# Patient Record
Sex: Female | Born: 1940 | Race: White | Hispanic: No | Marital: Married | Smoking: Never smoker
Health system: Southern US, Community
[De-identification: ages and names within clinical notes are randomized; demographics above are authoritative.]

## PROBLEM LIST (undated history)

## (undated) DIAGNOSIS — K219 Gastro-esophageal reflux disease without esophagitis: Secondary | ICD-10-CM

## (undated) DIAGNOSIS — I10 Essential (primary) hypertension: Secondary | ICD-10-CM

## (undated) DIAGNOSIS — R011 Cardiac murmur, unspecified: Secondary | ICD-10-CM

## (undated) DIAGNOSIS — E785 Hyperlipidemia, unspecified: Secondary | ICD-10-CM

## (undated) DIAGNOSIS — F419 Anxiety disorder, unspecified: Secondary | ICD-10-CM

## (undated) DIAGNOSIS — M1990 Unspecified osteoarthritis, unspecified site: Secondary | ICD-10-CM

## (undated) HISTORY — PX: COLONOSCOPY W/ POLYPECTOMY: SHX1380

---

## 2003-02-23 ENCOUNTER — Other Ambulatory Visit: Payer: Self-pay

## 2004-02-01 ENCOUNTER — Ambulatory Visit: Payer: Self-pay | Admitting: Family Medicine

## 2004-06-06 ENCOUNTER — Emergency Department: Payer: Self-pay | Admitting: Internal Medicine

## 2004-11-03 ENCOUNTER — Other Ambulatory Visit: Payer: Self-pay

## 2004-11-03 ENCOUNTER — Emergency Department: Payer: Self-pay | Admitting: Emergency Medicine

## 2006-03-07 ENCOUNTER — Ambulatory Visit: Payer: Self-pay | Admitting: Nurse Practitioner

## 2006-03-14 ENCOUNTER — Ambulatory Visit: Payer: Self-pay | Admitting: Nurse Practitioner

## 2007-05-06 ENCOUNTER — Ambulatory Visit: Payer: Self-pay | Admitting: Family Medicine

## 2007-05-22 ENCOUNTER — Ambulatory Visit: Payer: Self-pay | Admitting: Family Medicine

## 2007-09-13 ENCOUNTER — Emergency Department: Payer: Self-pay | Admitting: Internal Medicine

## 2008-07-29 ENCOUNTER — Ambulatory Visit: Payer: Self-pay | Admitting: Family Medicine

## 2008-08-16 ENCOUNTER — Ambulatory Visit: Payer: Self-pay | Admitting: Family Medicine

## 2009-08-01 ENCOUNTER — Ambulatory Visit: Payer: Self-pay | Admitting: Family Medicine

## 2010-09-13 ENCOUNTER — Ambulatory Visit: Payer: Self-pay | Admitting: Family Medicine

## 2011-11-06 ENCOUNTER — Ambulatory Visit: Payer: Self-pay | Admitting: Family Medicine

## 2012-11-06 ENCOUNTER — Ambulatory Visit: Payer: Self-pay | Admitting: Unknown Physician Specialty

## 2012-11-07 LAB — PATHOLOGY REPORT

## 2013-01-26 ENCOUNTER — Ambulatory Visit: Payer: Self-pay | Admitting: Family Medicine

## 2013-02-02 ENCOUNTER — Ambulatory Visit: Payer: Self-pay | Admitting: Family Medicine

## 2014-03-24 ENCOUNTER — Emergency Department: Payer: Self-pay | Admitting: Emergency Medicine

## 2014-05-03 ENCOUNTER — Ambulatory Visit
Admit: 2014-05-03 | Disposition: A | Discharge status: Home / Self Care | Payer: Self-pay | Attending: Nurse Practitioner | Admitting: Nurse Practitioner

## 2014-05-31 ENCOUNTER — Other Ambulatory Visit: Payer: Self-pay | Admitting: Internal Medicine

## 2014-05-31 DIAGNOSIS — R748 Abnormal levels of other serum enzymes: Secondary | ICD-10-CM

## 2014-06-03 ENCOUNTER — Ambulatory Visit
Admission: RE | Admit: 2014-06-03 | Discharge: 2014-06-03 | Disposition: A | Discharge status: Home / Self Care | Payer: Medicare HMO | Source: Ambulatory Visit | Attending: Internal Medicine | Admitting: Internal Medicine

## 2014-06-03 DIAGNOSIS — R748 Abnormal levels of other serum enzymes: Secondary | ICD-10-CM | POA: Insufficient documentation

## 2015-01-13 DIAGNOSIS — R42 Dizziness and giddiness: Secondary | ICD-10-CM | POA: Diagnosis not present

## 2015-01-13 DIAGNOSIS — I10 Essential (primary) hypertension: Secondary | ICD-10-CM | POA: Diagnosis not present

## 2015-01-13 DIAGNOSIS — I209 Angina pectoris, unspecified: Secondary | ICD-10-CM | POA: Diagnosis not present

## 2015-01-13 DIAGNOSIS — K219 Gastro-esophageal reflux disease without esophagitis: Secondary | ICD-10-CM | POA: Diagnosis not present

## 2015-01-13 DIAGNOSIS — R079 Chest pain, unspecified: Secondary | ICD-10-CM | POA: Diagnosis not present

## 2015-01-13 DIAGNOSIS — E784 Other hyperlipidemia: Secondary | ICD-10-CM | POA: Diagnosis not present

## 2015-02-03 DIAGNOSIS — I10 Essential (primary) hypertension: Secondary | ICD-10-CM | POA: Diagnosis not present

## 2015-02-03 DIAGNOSIS — E785 Hyperlipidemia, unspecified: Secondary | ICD-10-CM | POA: Diagnosis not present

## 2015-02-10 DIAGNOSIS — M150 Primary generalized (osteo)arthritis: Secondary | ICD-10-CM | POA: Diagnosis not present

## 2015-02-10 DIAGNOSIS — I10 Essential (primary) hypertension: Secondary | ICD-10-CM | POA: Diagnosis not present

## 2015-02-10 DIAGNOSIS — K219 Gastro-esophageal reflux disease without esophagitis: Secondary | ICD-10-CM | POA: Diagnosis not present

## 2015-02-10 DIAGNOSIS — E7800 Pure hypercholesterolemia, unspecified: Secondary | ICD-10-CM | POA: Diagnosis not present

## 2015-02-10 DIAGNOSIS — F5104 Psychophysiologic insomnia: Secondary | ICD-10-CM | POA: Diagnosis not present

## 2015-02-25 DIAGNOSIS — M5442 Lumbago with sciatica, left side: Secondary | ICD-10-CM | POA: Diagnosis not present

## 2015-02-25 DIAGNOSIS — M531 Cervicobrachial syndrome: Secondary | ICD-10-CM | POA: Diagnosis not present

## 2015-02-25 DIAGNOSIS — M955 Acquired deformity of pelvis: Secondary | ICD-10-CM | POA: Diagnosis not present

## 2015-02-25 DIAGNOSIS — M9901 Segmental and somatic dysfunction of cervical region: Secondary | ICD-10-CM | POA: Diagnosis not present

## 2015-02-25 DIAGNOSIS — M9903 Segmental and somatic dysfunction of lumbar region: Secondary | ICD-10-CM | POA: Diagnosis not present

## 2015-02-25 DIAGNOSIS — M9905 Segmental and somatic dysfunction of pelvic region: Secondary | ICD-10-CM | POA: Diagnosis not present

## 2015-03-02 DIAGNOSIS — M9901 Segmental and somatic dysfunction of cervical region: Secondary | ICD-10-CM | POA: Diagnosis not present

## 2015-03-02 DIAGNOSIS — M9903 Segmental and somatic dysfunction of lumbar region: Secondary | ICD-10-CM | POA: Diagnosis not present

## 2015-03-02 DIAGNOSIS — M955 Acquired deformity of pelvis: Secondary | ICD-10-CM | POA: Diagnosis not present

## 2015-03-02 DIAGNOSIS — M531 Cervicobrachial syndrome: Secondary | ICD-10-CM | POA: Diagnosis not present

## 2015-03-02 DIAGNOSIS — M5442 Lumbago with sciatica, left side: Secondary | ICD-10-CM | POA: Diagnosis not present

## 2015-03-02 DIAGNOSIS — M9905 Segmental and somatic dysfunction of pelvic region: Secondary | ICD-10-CM | POA: Diagnosis not present

## 2015-03-08 DIAGNOSIS — K602 Anal fissure, unspecified: Secondary | ICD-10-CM | POA: Diagnosis not present

## 2015-03-16 DIAGNOSIS — M461 Sacroiliitis, not elsewhere classified: Secondary | ICD-10-CM | POA: Diagnosis not present

## 2015-03-16 DIAGNOSIS — M5136 Other intervertebral disc degeneration, lumbar region: Secondary | ICD-10-CM | POA: Diagnosis not present

## 2015-03-16 DIAGNOSIS — M9903 Segmental and somatic dysfunction of lumbar region: Secondary | ICD-10-CM | POA: Diagnosis not present

## 2015-03-16 DIAGNOSIS — M9902 Segmental and somatic dysfunction of thoracic region: Secondary | ICD-10-CM | POA: Diagnosis not present

## 2015-03-16 DIAGNOSIS — M6080 Other myositis, unspecified site: Secondary | ICD-10-CM | POA: Diagnosis not present

## 2015-03-16 DIAGNOSIS — M9905 Segmental and somatic dysfunction of pelvic region: Secondary | ICD-10-CM | POA: Diagnosis not present

## 2015-03-30 DIAGNOSIS — M5136 Other intervertebral disc degeneration, lumbar region: Secondary | ICD-10-CM | POA: Diagnosis not present

## 2015-03-30 DIAGNOSIS — M6080 Other myositis, unspecified site: Secondary | ICD-10-CM | POA: Diagnosis not present

## 2015-03-30 DIAGNOSIS — M9903 Segmental and somatic dysfunction of lumbar region: Secondary | ICD-10-CM | POA: Diagnosis not present

## 2015-03-30 DIAGNOSIS — M461 Sacroiliitis, not elsewhere classified: Secondary | ICD-10-CM | POA: Diagnosis not present

## 2015-03-30 DIAGNOSIS — M9905 Segmental and somatic dysfunction of pelvic region: Secondary | ICD-10-CM | POA: Diagnosis not present

## 2015-03-30 DIAGNOSIS — M9902 Segmental and somatic dysfunction of thoracic region: Secondary | ICD-10-CM | POA: Diagnosis not present

## 2015-04-20 DIAGNOSIS — M5136 Other intervertebral disc degeneration, lumbar region: Secondary | ICD-10-CM | POA: Diagnosis not present

## 2015-04-20 DIAGNOSIS — M9902 Segmental and somatic dysfunction of thoracic region: Secondary | ICD-10-CM | POA: Diagnosis not present

## 2015-04-20 DIAGNOSIS — M6080 Other myositis, unspecified site: Secondary | ICD-10-CM | POA: Diagnosis not present

## 2015-04-20 DIAGNOSIS — M461 Sacroiliitis, not elsewhere classified: Secondary | ICD-10-CM | POA: Diagnosis not present

## 2015-04-20 DIAGNOSIS — M9905 Segmental and somatic dysfunction of pelvic region: Secondary | ICD-10-CM | POA: Diagnosis not present

## 2015-04-20 DIAGNOSIS — M9903 Segmental and somatic dysfunction of lumbar region: Secondary | ICD-10-CM | POA: Diagnosis not present

## 2015-05-10 DIAGNOSIS — M9905 Segmental and somatic dysfunction of pelvic region: Secondary | ICD-10-CM | POA: Diagnosis not present

## 2015-05-10 DIAGNOSIS — M9902 Segmental and somatic dysfunction of thoracic region: Secondary | ICD-10-CM | POA: Diagnosis not present

## 2015-05-10 DIAGNOSIS — M461 Sacroiliitis, not elsewhere classified: Secondary | ICD-10-CM | POA: Diagnosis not present

## 2015-05-10 DIAGNOSIS — M9903 Segmental and somatic dysfunction of lumbar region: Secondary | ICD-10-CM | POA: Diagnosis not present

## 2015-05-10 DIAGNOSIS — M6080 Other myositis, unspecified site: Secondary | ICD-10-CM | POA: Diagnosis not present

## 2015-05-10 DIAGNOSIS — M5136 Other intervertebral disc degeneration, lumbar region: Secondary | ICD-10-CM | POA: Diagnosis not present

## 2015-05-16 DIAGNOSIS — Z8679 Personal history of other diseases of the circulatory system: Secondary | ICD-10-CM | POA: Diagnosis not present

## 2015-05-25 DIAGNOSIS — R197 Diarrhea, unspecified: Secondary | ICD-10-CM | POA: Diagnosis not present

## 2015-05-26 DIAGNOSIS — K529 Noninfective gastroenteritis and colitis, unspecified: Secondary | ICD-10-CM | POA: Diagnosis not present

## 2015-05-31 DIAGNOSIS — K219 Gastro-esophageal reflux disease without esophagitis: Secondary | ICD-10-CM | POA: Diagnosis not present

## 2015-05-31 DIAGNOSIS — M150 Primary generalized (osteo)arthritis: Secondary | ICD-10-CM | POA: Diagnosis not present

## 2015-05-31 DIAGNOSIS — I10 Essential (primary) hypertension: Secondary | ICD-10-CM | POA: Diagnosis not present

## 2015-05-31 DIAGNOSIS — J0100 Acute maxillary sinusitis, unspecified: Secondary | ICD-10-CM | POA: Diagnosis not present

## 2015-08-08 DIAGNOSIS — F419 Anxiety disorder, unspecified: Secondary | ICD-10-CM | POA: Diagnosis not present

## 2015-08-08 DIAGNOSIS — K219 Gastro-esophageal reflux disease without esophagitis: Secondary | ICD-10-CM | POA: Diagnosis not present

## 2015-09-05 DIAGNOSIS — K219 Gastro-esophageal reflux disease without esophagitis: Secondary | ICD-10-CM | POA: Diagnosis not present

## 2015-09-21 DIAGNOSIS — L299 Pruritus, unspecified: Secondary | ICD-10-CM | POA: Diagnosis not present

## 2015-09-21 DIAGNOSIS — E7800 Pure hypercholesterolemia, unspecified: Secondary | ICD-10-CM | POA: Diagnosis not present

## 2015-09-21 DIAGNOSIS — R21 Rash and other nonspecific skin eruption: Secondary | ICD-10-CM | POA: Diagnosis not present

## 2015-09-21 DIAGNOSIS — Z131 Encounter for screening for diabetes mellitus: Secondary | ICD-10-CM | POA: Diagnosis not present

## 2015-09-21 DIAGNOSIS — K219 Gastro-esophageal reflux disease without esophagitis: Secondary | ICD-10-CM | POA: Diagnosis not present

## 2015-09-22 DIAGNOSIS — M9902 Segmental and somatic dysfunction of thoracic region: Secondary | ICD-10-CM | POA: Diagnosis not present

## 2015-09-22 DIAGNOSIS — M6080 Other myositis, unspecified site: Secondary | ICD-10-CM | POA: Diagnosis not present

## 2015-09-22 DIAGNOSIS — M9903 Segmental and somatic dysfunction of lumbar region: Secondary | ICD-10-CM | POA: Diagnosis not present

## 2015-09-22 DIAGNOSIS — M461 Sacroiliitis, not elsewhere classified: Secondary | ICD-10-CM | POA: Diagnosis not present

## 2015-09-22 DIAGNOSIS — M9905 Segmental and somatic dysfunction of pelvic region: Secondary | ICD-10-CM | POA: Diagnosis not present

## 2015-09-22 DIAGNOSIS — M5136 Other intervertebral disc degeneration, lumbar region: Secondary | ICD-10-CM | POA: Diagnosis not present

## 2015-09-28 DIAGNOSIS — M461 Sacroiliitis, not elsewhere classified: Secondary | ICD-10-CM | POA: Diagnosis not present

## 2015-09-28 DIAGNOSIS — M9902 Segmental and somatic dysfunction of thoracic region: Secondary | ICD-10-CM | POA: Diagnosis not present

## 2015-09-28 DIAGNOSIS — M9903 Segmental and somatic dysfunction of lumbar region: Secondary | ICD-10-CM | POA: Diagnosis not present

## 2015-09-28 DIAGNOSIS — M5136 Other intervertebral disc degeneration, lumbar region: Secondary | ICD-10-CM | POA: Diagnosis not present

## 2015-09-28 DIAGNOSIS — M9905 Segmental and somatic dysfunction of pelvic region: Secondary | ICD-10-CM | POA: Diagnosis not present

## 2015-09-28 DIAGNOSIS — M6080 Other myositis, unspecified site: Secondary | ICD-10-CM | POA: Diagnosis not present

## 2015-09-30 DIAGNOSIS — D1801 Hemangioma of skin and subcutaneous tissue: Secondary | ICD-10-CM | POA: Diagnosis not present

## 2015-09-30 DIAGNOSIS — L728 Other follicular cysts of the skin and subcutaneous tissue: Secondary | ICD-10-CM | POA: Diagnosis not present

## 2015-10-08 DIAGNOSIS — M25462 Effusion, left knee: Secondary | ICD-10-CM | POA: Diagnosis not present

## 2015-10-08 DIAGNOSIS — M25562 Pain in left knee: Secondary | ICD-10-CM | POA: Diagnosis not present

## 2015-10-11 DIAGNOSIS — M461 Sacroiliitis, not elsewhere classified: Secondary | ICD-10-CM | POA: Diagnosis not present

## 2015-10-11 DIAGNOSIS — M6080 Other myositis, unspecified site: Secondary | ICD-10-CM | POA: Diagnosis not present

## 2015-10-11 DIAGNOSIS — M5136 Other intervertebral disc degeneration, lumbar region: Secondary | ICD-10-CM | POA: Diagnosis not present

## 2015-10-11 DIAGNOSIS — M9905 Segmental and somatic dysfunction of pelvic region: Secondary | ICD-10-CM | POA: Diagnosis not present

## 2015-10-11 DIAGNOSIS — M9902 Segmental and somatic dysfunction of thoracic region: Secondary | ICD-10-CM | POA: Diagnosis not present

## 2015-10-11 DIAGNOSIS — M9903 Segmental and somatic dysfunction of lumbar region: Secondary | ICD-10-CM | POA: Diagnosis not present

## 2015-10-12 DIAGNOSIS — M6080 Other myositis, unspecified site: Secondary | ICD-10-CM | POA: Diagnosis not present

## 2015-10-12 DIAGNOSIS — M5136 Other intervertebral disc degeneration, lumbar region: Secondary | ICD-10-CM | POA: Diagnosis not present

## 2015-10-12 DIAGNOSIS — M9902 Segmental and somatic dysfunction of thoracic region: Secondary | ICD-10-CM | POA: Diagnosis not present

## 2015-10-12 DIAGNOSIS — M9905 Segmental and somatic dysfunction of pelvic region: Secondary | ICD-10-CM | POA: Diagnosis not present

## 2015-10-12 DIAGNOSIS — M9903 Segmental and somatic dysfunction of lumbar region: Secondary | ICD-10-CM | POA: Diagnosis not present

## 2015-10-12 DIAGNOSIS — M461 Sacroiliitis, not elsewhere classified: Secondary | ICD-10-CM | POA: Diagnosis not present

## 2015-10-13 DIAGNOSIS — M9903 Segmental and somatic dysfunction of lumbar region: Secondary | ICD-10-CM | POA: Diagnosis not present

## 2015-10-13 DIAGNOSIS — M461 Sacroiliitis, not elsewhere classified: Secondary | ICD-10-CM | POA: Diagnosis not present

## 2015-10-13 DIAGNOSIS — M6080 Other myositis, unspecified site: Secondary | ICD-10-CM | POA: Diagnosis not present

## 2015-10-13 DIAGNOSIS — M5136 Other intervertebral disc degeneration, lumbar region: Secondary | ICD-10-CM | POA: Diagnosis not present

## 2015-10-13 DIAGNOSIS — M9902 Segmental and somatic dysfunction of thoracic region: Secondary | ICD-10-CM | POA: Diagnosis not present

## 2015-10-13 DIAGNOSIS — M9905 Segmental and somatic dysfunction of pelvic region: Secondary | ICD-10-CM | POA: Diagnosis not present

## 2015-10-17 DIAGNOSIS — M25562 Pain in left knee: Secondary | ICD-10-CM | POA: Diagnosis not present

## 2015-10-17 DIAGNOSIS — M5431 Sciatica, right side: Secondary | ICD-10-CM | POA: Diagnosis not present

## 2015-11-04 ENCOUNTER — Other Ambulatory Visit: Payer: Self-pay | Admitting: Orthopedic Surgery

## 2015-11-04 DIAGNOSIS — M1712 Unilateral primary osteoarthritis, left knee: Secondary | ICD-10-CM | POA: Diagnosis not present

## 2015-11-04 DIAGNOSIS — M47816 Spondylosis without myelopathy or radiculopathy, lumbar region: Secondary | ICD-10-CM

## 2015-11-04 DIAGNOSIS — M5136 Other intervertebral disc degeneration, lumbar region: Secondary | ICD-10-CM | POA: Diagnosis not present

## 2015-11-04 DIAGNOSIS — M545 Low back pain: Secondary | ICD-10-CM | POA: Diagnosis not present

## 2015-11-04 DIAGNOSIS — M5416 Radiculopathy, lumbar region: Secondary | ICD-10-CM | POA: Diagnosis not present

## 2015-11-16 ENCOUNTER — Ambulatory Visit
Admission: RE | Admit: 2015-11-16 | Discharge: 2015-11-16 | Disposition: A | Discharge status: Home / Self Care | Payer: Commercial Managed Care - HMO | Source: Ambulatory Visit | Attending: Orthopedic Surgery | Admitting: Orthopedic Surgery

## 2015-11-16 DIAGNOSIS — M5126 Other intervertebral disc displacement, lumbar region: Secondary | ICD-10-CM | POA: Diagnosis not present

## 2015-11-16 DIAGNOSIS — M47816 Spondylosis without myelopathy or radiculopathy, lumbar region: Secondary | ICD-10-CM | POA: Insufficient documentation

## 2015-11-16 DIAGNOSIS — M5136 Other intervertebral disc degeneration, lumbar region: Secondary | ICD-10-CM | POA: Diagnosis not present

## 2015-12-11 DIAGNOSIS — K219 Gastro-esophageal reflux disease without esophagitis: Secondary | ICD-10-CM | POA: Diagnosis not present

## 2015-12-21 DIAGNOSIS — K219 Gastro-esophageal reflux disease without esophagitis: Secondary | ICD-10-CM | POA: Diagnosis not present

## 2015-12-21 DIAGNOSIS — I10 Essential (primary) hypertension: Secondary | ICD-10-CM | POA: Diagnosis not present

## 2015-12-21 DIAGNOSIS — E7800 Pure hypercholesterolemia, unspecified: Secondary | ICD-10-CM | POA: Diagnosis not present

## 2015-12-30 DIAGNOSIS — M180 Bilateral primary osteoarthritis of first carpometacarpal joints: Secondary | ICD-10-CM | POA: Diagnosis not present

## 2016-01-31 DIAGNOSIS — F419 Anxiety disorder, unspecified: Secondary | ICD-10-CM | POA: Diagnosis not present

## 2016-01-31 DIAGNOSIS — I10 Essential (primary) hypertension: Secondary | ICD-10-CM | POA: Diagnosis not present

## 2016-01-31 DIAGNOSIS — R079 Chest pain, unspecified: Secondary | ICD-10-CM | POA: Diagnosis not present

## 2016-01-31 DIAGNOSIS — E784 Other hyperlipidemia: Secondary | ICD-10-CM | POA: Diagnosis not present

## 2016-01-31 DIAGNOSIS — R9431 Abnormal electrocardiogram [ECG] [EKG]: Secondary | ICD-10-CM | POA: Diagnosis not present

## 2016-01-31 DIAGNOSIS — R42 Dizziness and giddiness: Secondary | ICD-10-CM | POA: Diagnosis not present

## 2016-01-31 DIAGNOSIS — K219 Gastro-esophageal reflux disease without esophagitis: Secondary | ICD-10-CM | POA: Diagnosis not present

## 2016-01-31 DIAGNOSIS — I208 Other forms of angina pectoris: Secondary | ICD-10-CM | POA: Diagnosis not present

## 2016-01-31 DIAGNOSIS — R0602 Shortness of breath: Secondary | ICD-10-CM | POA: Diagnosis not present

## 2016-02-16 DIAGNOSIS — R0602 Shortness of breath: Secondary | ICD-10-CM | POA: Diagnosis not present

## 2016-02-16 DIAGNOSIS — I208 Other forms of angina pectoris: Secondary | ICD-10-CM | POA: Diagnosis not present

## 2016-02-16 DIAGNOSIS — R9431 Abnormal electrocardiogram [ECG] [EKG]: Secondary | ICD-10-CM | POA: Diagnosis not present

## 2016-04-04 DIAGNOSIS — R300 Dysuria: Secondary | ICD-10-CM | POA: Diagnosis not present

## 2016-04-04 DIAGNOSIS — J069 Acute upper respiratory infection, unspecified: Secondary | ICD-10-CM | POA: Diagnosis not present

## 2016-04-04 DIAGNOSIS — I10 Essential (primary) hypertension: Secondary | ICD-10-CM | POA: Diagnosis not present

## 2016-04-12 DIAGNOSIS — J4520 Mild intermittent asthma, uncomplicated: Secondary | ICD-10-CM | POA: Diagnosis not present

## 2016-04-25 DIAGNOSIS — M25562 Pain in left knee: Secondary | ICD-10-CM | POA: Diagnosis not present

## 2016-04-25 DIAGNOSIS — M1712 Unilateral primary osteoarthritis, left knee: Secondary | ICD-10-CM | POA: Diagnosis not present

## 2016-05-04 DIAGNOSIS — L03111 Cellulitis of right axilla: Secondary | ICD-10-CM | POA: Diagnosis not present

## 2016-06-28 DIAGNOSIS — K219 Gastro-esophageal reflux disease without esophagitis: Secondary | ICD-10-CM | POA: Diagnosis not present

## 2016-06-28 DIAGNOSIS — Z1231 Encounter for screening mammogram for malignant neoplasm of breast: Secondary | ICD-10-CM | POA: Diagnosis not present

## 2016-06-28 DIAGNOSIS — Z0000 Encounter for general adult medical examination without abnormal findings: Secondary | ICD-10-CM | POA: Diagnosis not present

## 2016-06-28 DIAGNOSIS — Z780 Asymptomatic menopausal state: Secondary | ICD-10-CM | POA: Diagnosis not present

## 2016-06-28 DIAGNOSIS — Z23 Encounter for immunization: Secondary | ICD-10-CM | POA: Diagnosis not present

## 2016-06-28 DIAGNOSIS — E7800 Pure hypercholesterolemia, unspecified: Secondary | ICD-10-CM | POA: Diagnosis not present

## 2016-06-28 DIAGNOSIS — I10 Essential (primary) hypertension: Secondary | ICD-10-CM | POA: Diagnosis not present

## 2016-07-09 DIAGNOSIS — W57XXXA Bitten or stung by nonvenomous insect and other nonvenomous arthropods, initial encounter: Secondary | ICD-10-CM | POA: Diagnosis not present

## 2016-07-09 DIAGNOSIS — S30861A Insect bite (nonvenomous) of abdominal wall, initial encounter: Secondary | ICD-10-CM | POA: Diagnosis not present

## 2016-07-10 DIAGNOSIS — M8588 Other specified disorders of bone density and structure, other site: Secondary | ICD-10-CM | POA: Diagnosis not present

## 2016-10-18 DIAGNOSIS — R195 Other fecal abnormalities: Secondary | ICD-10-CM | POA: Diagnosis not present

## 2016-10-24 ENCOUNTER — Other Ambulatory Visit: Payer: Self-pay | Admitting: Internal Medicine

## 2016-10-24 DIAGNOSIS — K5909 Other constipation: Secondary | ICD-10-CM | POA: Diagnosis not present

## 2016-10-24 DIAGNOSIS — K219 Gastro-esophageal reflux disease without esophagitis: Secondary | ICD-10-CM | POA: Diagnosis not present

## 2016-10-24 DIAGNOSIS — R739 Hyperglycemia, unspecified: Secondary | ICD-10-CM | POA: Diagnosis not present

## 2016-10-24 DIAGNOSIS — Z1231 Encounter for screening mammogram for malignant neoplasm of breast: Secondary | ICD-10-CM

## 2016-10-24 DIAGNOSIS — Z1211 Encounter for screening for malignant neoplasm of colon: Secondary | ICD-10-CM | POA: Diagnosis not present

## 2016-10-24 DIAGNOSIS — E7800 Pure hypercholesterolemia, unspecified: Secondary | ICD-10-CM | POA: Diagnosis not present

## 2016-10-25 DIAGNOSIS — H2511 Age-related nuclear cataract, right eye: Secondary | ICD-10-CM | POA: Diagnosis not present

## 2016-10-26 ENCOUNTER — Ambulatory Visit
Admission: RE | Admit: 2016-10-26 | Discharge: 2016-10-26 | Disposition: A | Discharge status: Home / Self Care | Payer: Medicare HMO | Source: Ambulatory Visit | Attending: Internal Medicine | Admitting: Internal Medicine

## 2016-10-26 DIAGNOSIS — Z1231 Encounter for screening mammogram for malignant neoplasm of breast: Secondary | ICD-10-CM | POA: Diagnosis not present

## 2016-10-30 ENCOUNTER — Other Ambulatory Visit: Payer: Self-pay | Admitting: Internal Medicine

## 2016-10-30 DIAGNOSIS — N6489 Other specified disorders of breast: Secondary | ICD-10-CM

## 2016-10-30 DIAGNOSIS — R928 Other abnormal and inconclusive findings on diagnostic imaging of breast: Secondary | ICD-10-CM

## 2016-11-08 ENCOUNTER — Ambulatory Visit
Admission: RE | Admit: 2016-11-08 | Discharge: 2016-11-08 | Disposition: A | Discharge status: Home / Self Care | Payer: Medicare HMO | Source: Ambulatory Visit | Attending: Internal Medicine | Admitting: Internal Medicine

## 2016-11-08 DIAGNOSIS — N6489 Other specified disorders of breast: Secondary | ICD-10-CM | POA: Diagnosis not present

## 2016-11-08 DIAGNOSIS — R928 Other abnormal and inconclusive findings on diagnostic imaging of breast: Secondary | ICD-10-CM

## 2016-12-12 DIAGNOSIS — Z86010 Personal history of colonic polyps: Secondary | ICD-10-CM | POA: Diagnosis not present

## 2016-12-12 DIAGNOSIS — K5909 Other constipation: Secondary | ICD-10-CM | POA: Diagnosis not present

## 2016-12-12 DIAGNOSIS — K219 Gastro-esophageal reflux disease without esophagitis: Secondary | ICD-10-CM | POA: Diagnosis not present

## 2017-01-25 DIAGNOSIS — M25542 Pain in joints of left hand: Secondary | ICD-10-CM | POA: Diagnosis not present

## 2017-01-25 DIAGNOSIS — M79642 Pain in left hand: Secondary | ICD-10-CM | POA: Diagnosis not present

## 2017-01-25 DIAGNOSIS — M25541 Pain in joints of right hand: Secondary | ICD-10-CM | POA: Diagnosis not present

## 2017-01-25 DIAGNOSIS — K219 Gastro-esophageal reflux disease without esophagitis: Secondary | ICD-10-CM | POA: Diagnosis not present

## 2017-01-25 DIAGNOSIS — R739 Hyperglycemia, unspecified: Secondary | ICD-10-CM | POA: Diagnosis not present

## 2017-01-25 DIAGNOSIS — E7800 Pure hypercholesterolemia, unspecified: Secondary | ICD-10-CM | POA: Diagnosis not present

## 2017-01-28 DIAGNOSIS — H02055 Trichiasis without entropian left lower eyelid: Secondary | ICD-10-CM | POA: Diagnosis not present

## 2017-01-31 DIAGNOSIS — F419 Anxiety disorder, unspecified: Secondary | ICD-10-CM | POA: Diagnosis not present

## 2017-01-31 DIAGNOSIS — R079 Chest pain, unspecified: Secondary | ICD-10-CM | POA: Diagnosis not present

## 2017-01-31 DIAGNOSIS — I10 Essential (primary) hypertension: Secondary | ICD-10-CM | POA: Diagnosis not present

## 2017-01-31 DIAGNOSIS — R0602 Shortness of breath: Secondary | ICD-10-CM | POA: Diagnosis not present

## 2017-01-31 DIAGNOSIS — I208 Other forms of angina pectoris: Secondary | ICD-10-CM | POA: Diagnosis not present

## 2017-01-31 DIAGNOSIS — R9431 Abnormal electrocardiogram [ECG] [EKG]: Secondary | ICD-10-CM | POA: Diagnosis not present

## 2017-01-31 DIAGNOSIS — R42 Dizziness and giddiness: Secondary | ICD-10-CM | POA: Diagnosis not present

## 2017-01-31 DIAGNOSIS — E7849 Other hyperlipidemia: Secondary | ICD-10-CM | POA: Diagnosis not present

## 2017-01-31 DIAGNOSIS — K219 Gastro-esophageal reflux disease without esophagitis: Secondary | ICD-10-CM | POA: Diagnosis not present

## 2017-02-01 DIAGNOSIS — Z23 Encounter for immunization: Secondary | ICD-10-CM | POA: Diagnosis not present

## 2017-02-01 DIAGNOSIS — Z0000 Encounter for general adult medical examination without abnormal findings: Secondary | ICD-10-CM | POA: Diagnosis not present

## 2017-03-20 DIAGNOSIS — J209 Acute bronchitis, unspecified: Secondary | ICD-10-CM | POA: Diagnosis not present

## 2017-03-22 DIAGNOSIS — M180 Bilateral primary osteoarthritis of first carpometacarpal joints: Secondary | ICD-10-CM | POA: Diagnosis not present

## 2017-04-10 ENCOUNTER — Encounter: Admission: RE | Payer: Self-pay | Source: Ambulatory Visit

## 2017-04-10 ENCOUNTER — Ambulatory Visit
Admission: RE | Admit: 2017-04-10 | Payer: Medicare HMO | Source: Ambulatory Visit | Admitting: Unknown Physician Specialty

## 2017-04-10 ENCOUNTER — Ambulatory Visit: Admit: 2017-04-10 | Payer: Medicare HMO | Admitting: Unknown Physician Specialty

## 2017-04-10 SURGERY — COLONOSCOPY WITH PROPOFOL
Anesthesia: General

## 2017-05-09 DIAGNOSIS — M170 Bilateral primary osteoarthritis of knee: Secondary | ICD-10-CM | POA: Diagnosis not present

## 2017-05-09 DIAGNOSIS — R001 Bradycardia, unspecified: Secondary | ICD-10-CM | POA: Diagnosis not present

## 2017-06-06 DIAGNOSIS — H02055 Trichiasis without entropian left lower eyelid: Secondary | ICD-10-CM | POA: Diagnosis not present

## 2017-06-27 DIAGNOSIS — W57XXXA Bitten or stung by nonvenomous insect and other nonvenomous arthropods, initial encounter: Secondary | ICD-10-CM | POA: Diagnosis not present

## 2017-06-27 DIAGNOSIS — S80861A Insect bite (nonvenomous), right lower leg, initial encounter: Secondary | ICD-10-CM | POA: Diagnosis not present

## 2017-06-27 DIAGNOSIS — S80862A Insect bite (nonvenomous), left lower leg, initial encounter: Secondary | ICD-10-CM | POA: Diagnosis not present

## 2017-08-09 DIAGNOSIS — R001 Bradycardia, unspecified: Secondary | ICD-10-CM | POA: Diagnosis not present

## 2017-08-15 DIAGNOSIS — R002 Palpitations: Secondary | ICD-10-CM | POA: Diagnosis not present

## 2017-08-15 DIAGNOSIS — I10 Essential (primary) hypertension: Secondary | ICD-10-CM | POA: Diagnosis not present

## 2017-10-14 DIAGNOSIS — H02055 Trichiasis without entropian left lower eyelid: Secondary | ICD-10-CM | POA: Diagnosis not present

## 2017-11-14 DIAGNOSIS — L03811 Cellulitis of head [any part, except face]: Secondary | ICD-10-CM | POA: Diagnosis not present

## 2018-01-02 DIAGNOSIS — R109 Unspecified abdominal pain: Secondary | ICD-10-CM | POA: Diagnosis not present

## 2018-01-02 DIAGNOSIS — K219 Gastro-esophageal reflux disease without esophagitis: Secondary | ICD-10-CM | POA: Diagnosis not present

## 2018-01-02 DIAGNOSIS — Z86010 Personal history of colonic polyps: Secondary | ICD-10-CM | POA: Diagnosis not present

## 2018-03-03 ENCOUNTER — Ambulatory Visit: Payer: Medicare Other | Admitting: Anesthesiology

## 2018-03-03 ENCOUNTER — Encounter: Payer: Self-pay | Admitting: Anesthesiology

## 2018-03-03 ENCOUNTER — Encounter
Admission: RE | Disposition: A | Discharge status: Home / Self Care | Payer: Self-pay | Source: Home / Self Care | Attending: Unknown Physician Specialty

## 2018-03-03 ENCOUNTER — Ambulatory Visit
Admission: RE | Admit: 2018-03-03 | Discharge: 2018-03-03 | Disposition: A | Discharge status: Home / Self Care | Payer: Medicare Other | Attending: Unknown Physician Specialty | Admitting: Unknown Physician Specialty

## 2018-03-03 DIAGNOSIS — Z86010 Personal history of colonic polyps: Secondary | ICD-10-CM | POA: Insufficient documentation

## 2018-03-03 DIAGNOSIS — Z1211 Encounter for screening for malignant neoplasm of colon: Secondary | ICD-10-CM | POA: Insufficient documentation

## 2018-03-03 DIAGNOSIS — Z7982 Long term (current) use of aspirin: Secondary | ICD-10-CM | POA: Diagnosis not present

## 2018-03-03 DIAGNOSIS — K640 First degree hemorrhoids: Secondary | ICD-10-CM | POA: Diagnosis not present

## 2018-03-03 DIAGNOSIS — Z79899 Other long term (current) drug therapy: Secondary | ICD-10-CM | POA: Insufficient documentation

## 2018-03-03 DIAGNOSIS — I10 Essential (primary) hypertension: Secondary | ICD-10-CM | POA: Insufficient documentation

## 2018-03-03 DIAGNOSIS — D123 Benign neoplasm of transverse colon: Secondary | ICD-10-CM | POA: Insufficient documentation

## 2018-03-03 DIAGNOSIS — K635 Polyp of colon: Secondary | ICD-10-CM | POA: Insufficient documentation

## 2018-03-03 DIAGNOSIS — Z87891 Personal history of nicotine dependence: Secondary | ICD-10-CM | POA: Insufficient documentation

## 2018-03-03 DIAGNOSIS — E785 Hyperlipidemia, unspecified: Secondary | ICD-10-CM | POA: Diagnosis not present

## 2018-03-03 DIAGNOSIS — K219 Gastro-esophageal reflux disease without esophagitis: Secondary | ICD-10-CM | POA: Diagnosis not present

## 2018-03-03 HISTORY — DX: Gastro-esophageal reflux disease without esophagitis: K21.9

## 2018-03-03 HISTORY — DX: Essential (primary) hypertension: I10

## 2018-03-03 HISTORY — PX: COLONOSCOPY WITH PROPOFOL: SHX5780

## 2018-03-03 HISTORY — DX: Hyperlipidemia, unspecified: E78.5

## 2018-03-03 SURGERY — COLONOSCOPY WITH PROPOFOL
Anesthesia: General

## 2018-03-03 MED ORDER — FENTANYL CITRATE (PF) 100 MCG/2ML IJ SOLN
INTRAMUSCULAR | Status: AC
  Filled 2018-03-03: qty 2

## 2018-03-03 MED ORDER — PROPOFOL 500 MG/50ML IV EMUL
INTRAVENOUS | Status: AC
  Filled 2018-03-03: qty 50

## 2018-03-03 MED ORDER — SODIUM CHLORIDE 0.9 % IV SOLN: INTRAVENOUS | Status: DC

## 2018-03-03 MED ORDER — SODIUM CHLORIDE 0.9 % IV SOLN
INTRAVENOUS | Status: DC
  Administered 2018-03-03: 13:00:00 via INTRAVENOUS

## 2018-03-03 MED ORDER — PROPOFOL 500 MG/50ML IV EMUL
INTRAVENOUS | Status: DC | PRN
  Administered 2018-03-03: 120 ug/kg/min via INTRAVENOUS

## 2018-03-03 MED ORDER — FENTANYL CITRATE (PF) 100 MCG/2ML IJ SOLN
INTRAMUSCULAR | Status: DC | PRN
  Administered 2018-03-03 (×2): 50 ug via INTRAVENOUS

## 2018-03-03 NOTE — Anesthesia Preprocedure Evaluation (Signed)
Anesthesia Evaluation  Patient identified by MRN, date of birth, ID band Patient awake    Reviewed: Allergy & Precautions, NPO status , Patient's Chart, lab work & pertinent test results  History of Anesthesia Complications Negative for: history of anesthetic complications  Airway Mallampati: II  TM Distance: >3 FB Neck ROM: Full    Dental no notable dental hx.    Pulmonary neg sleep apnea, neg COPD, former smoker,    breath sounds clear to auscultation- rhonchi (-) wheezing      Cardiovascular hypertension, Pt. on medications (-) CAD, (-) Past MI, (-) Cardiac Stents and (-) CABG  Rhythm:Regular Rate:Normal - Systolic murmurs and - Diastolic murmurs    Neuro/Psych neg Seizures negative neurological ROS  negative psych ROS   GI/Hepatic Neg liver ROS, GERD  ,  Endo/Other  negative endocrine ROSneg diabetes  Renal/GU negative Renal ROS     Musculoskeletal negative musculoskeletal ROS (+)   Abdominal (+) + obese,   Peds  Hematology negative hematology ROS (+)   Anesthesia Other Findings Past Medical History: No date: GERD (gastroesophageal reflux disease) No date: Hyperlipidemia No date: Hypertension   Reproductive/Obstetrics                             Anesthesia Physical Anesthesia Plan  ASA: II  Anesthesia Plan: General   Post-op Pain Management:    Induction: Intravenous  PONV Risk Score and Plan: 2 and Propofol infusion  Airway Management Planned: Natural Airway  Additional Equipment:   Intra-op Plan:   Post-operative Plan:   Informed Consent: I have reviewed the patients History and Physical, chart, labs and discussed the procedure including the risks, benefits and alternatives for the proposed anesthesia with the patient or authorized representative who has indicated his/her understanding and acceptance.     Dental advisory given  Plan Discussed with: CRNA and  Anesthesiologist  Anesthesia Plan Comments:         Anesthesia Quick Evaluation

## 2018-03-03 NOTE — Transfer of Care (Signed)
Immediate Anesthesia Transfer of Care Note  Patient: Karen Harvey  Procedure(s) Performed: COLONOSCOPY WITH PROPOFOL (N/A )  Patient Location: PACU  Anesthesia Type:General  Level of Consciousness: awake and sedated  Airway & Oxygen Therapy: Patient Spontanous Breathing and Patient connected to nasal cannula oxygen  Post-op Assessment: Report given to RN and Post -op Vital signs reviewed and stable  Post vital signs: Reviewed and stable  Last Vitals:  Vitals Value Taken Time  BP    Temp    Pulse    Resp    SpO2      Last Pain:  Vitals:   03/03/18 1303  TempSrc: Tympanic         Complications: No apparent anesthesia complications

## 2018-03-03 NOTE — Op Note (Signed)
[facility] Gastroenterology Patient Name: Karen Harvey Procedure Date: 03/03/2018 1:42 PM MRN: [id number] Account #: [id number] Date of Birth: [birth year] Admit Type: Outpatient Age: 78 Room: [facility] ENDO ROOM 1 Gender: Female Note Status: Finalized Procedure:            Colonoscopy Indications:          High risk colon cancer surveillance: Personal history                        of colonic polyps Providers:            Manya Silvas, MD Referring MD:         Glendon Axe (Referring MD) Medicines:            Propofol per Anesthesia Complications:        No immediate complications. Procedure:            Pre-Anesthesia Assessment:                       - After reviewing the risks and benefits, the patient                        was deemed in satisfactory condition to undergo the                        procedure.                       After obtaining informed consent, the colonoscope was                        passed under direct vision. Throughout the procedure,                        the patient's blood pressure, pulse, and oxygen                        saturations were monitored continuously. The                        Colonoscope was introduced through the anus and                        advanced to the the cecum, identified by appendiceal                        orifice and ileocecal valve. The colonoscopy was                        performed without difficulty. The patient tolerated the                        procedure well. The quality of the bowel preparation                        was good. Findings:      Three sessile polyps were found in the transverse colon. The polyps were       diminutive in size. These polyps were removed with a jumbo cold forceps.       Resection and retrieval were complete.      Two sessile polyps were  found in the recto-sigmoid colon. The polyps       were diminutive in size. These polyps were removed with a jumbo cold   forceps. Resection and retrieval were complete.      Internal hemorrhoids were found during endoscopy. The hemorrhoids were       small and Grade I (internal hemorrhoids that do not prolapse). Impression:           - Three diminutive polyps in the transverse colon,                        removed with a jumbo cold forceps. Resected and                        retrieved.                       - Two diminutive polyps at the recto-sigmoid colon,                        removed with a jumbo cold forceps. Resected and                        retrieved.                       - Internal hemorrhoids. Recommendation:       - Await pathology results. Procedure Code(s):    --- Professional ---                       [phone], Colonoscopy, flexible; with biopsy, single or                        multiple Diagnosis Code(s):    --- Professional ---                       Z86.010, Personal history of colonic polyps                       D12.3, Benign neoplasm of transverse colon (hepatic                        flexure or splenic flexure)                       D12.7, Benign neoplasm of rectosigmoid junction                       K64.0, First degree hemorrhoids CPT copyright 2018 American Medical Association. All rights reserved. The codes documented in this report are preliminary and upon coder review may  be revised to meet current compliance requirements. Manya Silvas, MD 03/03/2018 2:16:00 PM This report has been signed electronically. Number of Addenda: 0 Note Initiated On: 03/03/2018 1:42 PM Scope Withdrawal Time: 0 hours 11 minutes 43 seconds  Total Procedure Duration: 0 hours 23 minutes 20 seconds       [facility]

## 2018-03-03 NOTE — Anesthesia Procedure Notes (Signed)
Performed by: Cook-Martin, Paije Goodhart Pre-anesthesia Checklist: Patient identified, Emergency Drugs available, Suction available, Patient being monitored and Timeout performed Patient Re-evaluated:Patient Re-evaluated prior to induction Oxygen Delivery Method: Nasal cannula Preoxygenation: Pre-oxygenation with 100% oxygen Induction Type: IV induction Placement Confirmation: CO2 detector and positive ETCO2       

## 2018-03-03 NOTE — Anesthesia Post-op Follow-up Note (Signed)
Anesthesia QCDR form completed.        

## 2018-03-03 NOTE — Anesthesia Postprocedure Evaluation (Signed)
Anesthesia Post Note  Patient: Geoffery Lyons  Procedure(s) Performed: COLONOSCOPY WITH PROPOFOL (N/A )  Patient location during evaluation: Endoscopy Anesthesia Type: General Level of consciousness: awake and alert and oriented Pain management: pain level controlled Vital Signs Assessment: post-procedure vital signs reviewed and stable Respiratory status: spontaneous breathing, nonlabored ventilation and respiratory function stable Cardiovascular status: blood pressure returned to baseline and stable Postop Assessment: no signs of nausea or vomiting Anesthetic complications: no     Last Vitals:  Vitals:   03/03/18 1426 03/03/18 1436  BP: (!) 117/52 (!) 143/62  Pulse: (!) 57 62  Resp: 12 15  Temp:    SpO2: 96% 96%    Last Pain:  Vitals:   03/03/18 1436  TempSrc:   PainSc: 0-No pain                 John Vasconcelos

## 2018-03-03 NOTE — H&P (Signed)
Primary Care Physician:  Glendon Axe, MD Primary Gastroenterologist:  Dr. Vira Agar  Pre-Procedure History & Physical: HPI:  Karen Harvey is a 78 y.o. female is here for an colonoscopy.   Past Medical History:  Diagnosis Date  . GERD (gastroesophageal reflux disease)   . Hyperlipidemia   . Hypertension     Past Surgical History:  Procedure Laterality Date  . COLONOSCOPY W/ POLYPECTOMY  11/06/2012, 07/14/1997   adenomatous polyp    Prior to Admission medications   Medication Sig Start Date End Date Taking? Authorizing Provider  aspirin EC 81 MG tablet Take 81 mg by mouth daily.   Yes [provider]  atenolol (TENORMIN) 25 MG tablet Take 12.5 mg by mouth daily. 0.5 tablet oral daily.   Yes [provider]  pantoprazole (PROTONIX) 40 MG tablet Take 40 mg by mouth daily.   Yes [provider]  pravastatin (PRAVACHOL) 20 MG tablet Take 20 mg by mouth at bedtime.   Yes [provider]  ranitidine (ZANTAC) 150 MG tablet Take 150 mg by mouth 2 (two) times daily.   Yes [provider]    Allergies as of 01/03/2018  . (Not on File)    History reviewed. No pertinent family history.  Social History   Socioeconomic History  . Marital status: Married    Spouse name: Not on file  . Number of children: Not on file  . Years of education: Not on file  . Highest education level: Not on file  Occupational History  . Not on file  Social Needs  . Financial resource strain: Not on file  . Food insecurity:    Worry: Not on file    Inability: Not on file  . Transportation needs:    Medical: Not on file    Non-medical: Not on file  Tobacco Use  . Smoking status: Former Research scientist (life sciences)  . Smokeless tobacco: Never Used  Substance and Sexual Activity  . Alcohol use: Never    Frequency: Never  . Drug use: Never  . Sexual activity: Not on file  Lifestyle  . Physical activity:    Days per week: Not on file    Minutes per session: Not on file   . Stress: Not on file  Relationships  . Social connections:    Talks on phone: Not on file    Gets together: Not on file    Attends religious service: Not on file    Active member of club or organization: Not on file    Attends meetings of clubs or organizations: Not on file    Relationship status: Not on file  . Intimate partner violence:    Fear of current or ex partner: Not on file    Emotionally abused: Not on file    Physically abused: Not on file    Forced sexual activity: Not on file  Other Topics Concern  . Not on file  Social History Narrative  . Not on file    Review of Systems: See HPI, otherwise negative ROS  Physical Exam: BP (!) 164/67   Pulse (!) 55   Temp (!) 97.3 F (36.3 C) (Tympanic)   Resp 18   Ht 5\' 2"  (1.575 m)   Wt 76.2 kg   SpO2 100%   BMI 30.73 kg/m  General:   Alert,  pleasant and cooperative in NAD Head:  Normocephalic and atraumatic. Neck:  Supple; no masses or thyromegaly. Lungs:  Clear throughout to auscultation.    Heart:  Regular rate and rhythm. Abdomen:  Soft, nontender and nondistended. Normal bowel sounds, without guarding, and without rebound.   Neurologic:  Alert and  oriented x4;  grossly normal neurologically.  Impression/Plan: Karen Harvey is here for an colonoscopy to be performed for [facility] colon polyps.  Risks, benefits, limitations, and alternatives regarding  colonoscopy have been reviewed with the patient.  Questions have been answered.  All parties agreeable.   Gaylyn Cheers, MD  03/03/2018, 1:36 PM

## 2018-03-05 LAB — SURGICAL PATHOLOGY

## 2018-08-19 ENCOUNTER — Encounter: Payer: Self-pay | Admitting: Obstetrics and Gynecology

## 2018-08-19 ENCOUNTER — Other Ambulatory Visit: Payer: Self-pay

## 2018-08-19 ENCOUNTER — Ambulatory Visit (INDEPENDENT_AMBULATORY_CARE_PROVIDER_SITE_OTHER): Payer: Medicare Other | Admitting: Obstetrics and Gynecology

## 2018-08-19 VITALS — BP 120/80 | Ht 62.0 in | Wt 161.0 lb

## 2018-08-19 DIAGNOSIS — N751 Abscess of Bartholin's gland: Secondary | ICD-10-CM

## 2018-08-19 MED ORDER — AMOXICILLIN-POT CLAVULANATE 875-125 MG PO TABS: 1.0000 | ORAL_TABLET | Freq: Two times a day (BID) | ORAL | 0 refills | Status: AC

## 2018-08-19 NOTE — Progress Notes (Signed)
Patient ID: Karen Harvey, female   DOB: 1940/09/11, 78 y.o.   MRN: [id number]  Reason for Consult: Bartholin's Cyst   Referred by Glendon Axe, MD  Subjective:     HPI:  Karen Harvey is a 78 y.o. female . Her bottom is hurting her. She believes she has a bartholin gland abscess. She reports that she had these before when she was younger. They usually resolved when lanced and with antibiotics. She has not tried any therapy yet for this abscess. She denies fevers.   Past Medical History:  Diagnosis Date  . GERD (gastroesophageal reflux disease)   . Hyperlipidemia   . Hypertension    History reviewed. No pertinent family history. Past Surgical History:  Procedure Laterality Date  . COLONOSCOPY W/ POLYPECTOMY  11/06/2012, 07/14/1997   adenomatous polyp  . COLONOSCOPY WITH PROPOFOL N/A 03/03/2018   Procedure: COLONOSCOPY WITH PROPOFOL;  Surgeon: Manya Silvas, MD;  Location: [facility] ENDOSCOPY;  Service: Endoscopy;  Laterality: N/A;    Short Social History:  Social History   Tobacco Use  . Smoking status: Former Research scientist (life sciences)  . Smokeless tobacco: Never Used  Substance Use Topics  . Alcohol use: Never    Frequency: Never    No Known Allergies  Current Outpatient Medications  Medication Sig Dispense Refill  . aspirin EC 81 MG tablet Take 81 mg by mouth daily.    Marland Kitchen atenolol (TENORMIN) 25 MG tablet Take 12.5 mg by mouth daily. 0.5 tablet oral daily.    . pantoprazole (PROTONIX) 40 MG tablet Take 40 mg by mouth daily.    . pravastatin (PRAVACHOL) 20 MG tablet Take 20 mg by mouth at bedtime.    . ranitidine (ZANTAC) 150 MG tablet Take 150 mg by mouth 2 (two) times daily.    Marland Kitchen amoxicillin-clavulanate (AUGMENTIN) 875-125 MG tablet Take 1 tablet by mouth 2 (two) times daily for 7 days. 14 tablet 0   No current facility-administered medications for this visit.     Review of Systems  Constitutional: Negative for chills, fatigue, fever and unexpected weight change.  HENT: Negative  for trouble swallowing.  Eyes: Negative for loss of vision.  Respiratory: Negative for cough, shortness of breath and wheezing.  Cardiovascular: Negative for chest pain, leg swelling, palpitations and syncope.  GI: Negative for abdominal pain, blood in stool, diarrhea, nausea and vomiting.  GU: Negative for difficulty urinating, dysuria, frequency and hematuria.  Musculoskeletal: Negative for back pain, leg pain and joint pain.  Skin: Negative for rash.  Neurological: Negative for dizziness, headaches, light-headedness, numbness and seizures.  Psychiatric: Negative for behavioral problem, confusion, depressed mood and sleep disturbance.        Objective:  Objective   Vitals:   08/19/18 1328  BP: 120/80  Weight: 161 lb (73 kg)  Height: 5\' 2"  (1.575 m)   Body mass index is 29.45 kg/m.  Physical Exam Vitals signs and nursing note reviewed.  Constitutional:      Appearance: She is well-developed.  HENT:     Head: Normocephalic and atraumatic.  Eyes:     Pupils: Pupils are equal, round, and reactive to light.  Cardiovascular:     Rate and Rhythm: Normal rate and regular rhythm.  Pulmonary:     Effort: Pulmonary effort is normal. No respiratory distress.  Genitourinary:   Skin:    General: Skin is warm and dry.  Neurological:     Mental Status: She is alert and oriented to person, place, and time.  Psychiatric:  Behavior: Behavior normal.        Thought Content: Thought content normal.        Judgment: Judgment normal.    Incision and Drainage Procedure Note Bartholin gland abscess cleaned with betadine x3. Injected with 3 cc of 1% lidocaine. Small incision made with 11 blade scalpel. Copious purulent fluid drained from the abscess. Culture obtained. Foul smelling.  Wound flushed clean with sterile saline.       Assessment/Plan:     78 yo with bartholin gland abscess.  Incision and Drainage today- copious purulent material. Wound culture collected.   Cellulitis present- given oral anitibiotic prescription. Advised twice a day sitz baths. Return in 1 week for follow up.  Advised to return sooner if symptoms worsen or do not improve.   Adrian Prows MD [facility], [facility] 08/19/2018 2:12 PM

## 2018-08-21 LAB — WOUND CULTURE: Organism ID, Bacteria: NONE SEEN

## 2018-09-03 ENCOUNTER — Encounter: Payer: Self-pay | Admitting: Obstetrics & Gynecology

## 2018-09-03 ENCOUNTER — Ambulatory Visit (INDEPENDENT_AMBULATORY_CARE_PROVIDER_SITE_OTHER): Payer: Medicare Other | Admitting: Obstetrics & Gynecology

## 2018-09-03 ENCOUNTER — Other Ambulatory Visit: Payer: Self-pay

## 2018-09-03 VITALS — BP 140/80 | Ht 62.0 in | Wt 159.0 lb

## 2018-09-03 DIAGNOSIS — N751 Abscess of Bartholin's gland: Secondary | ICD-10-CM

## 2018-09-03 MED ORDER — CEPHALEXIN 500 MG PO CAPS: 500.0000 mg | ORAL_CAPSULE | Freq: Four times a day (QID) | ORAL | 2 refills | Status: DC

## 2018-09-03 NOTE — Patient Instructions (Signed)
Cephalexin tablets or capsules °What is this medicine? °CEPHALEXIN (sef a LEX in) is a cephalosporin antibiotic. It is used to treat certain kinds of bacterial infections It will not work for colds, flu, or other viral infections. °This medicine may be used for other purposes; ask your health care provider or pharmacist if you have questions. °COMMON BRAND NAME(S): Biocef, Daxbia, Keflex, Keftab °What should I tell my health care provider before I take this medicine? °They need to know if you have any of these conditions: °· kidney disease °· stomach or intestine problems, especially colitis °· an unusual or allergic reaction to cephalexin, other cephalosporins, penicillins, other antibiotics, medicines, foods, dyes or preservatives °· pregnant or trying to get pregnant °· breast-feeding °How should I use this medicine? °Take this medicine by mouth with a full glass of water. Follow the directions on the prescription label. This medicine can be taken with or without food. Take your medicine at regular intervals. Do not take your medicine more often than directed. Take all of your medicine as directed even if you think you are better. Do not skip doses or stop your medicine early. °Talk to your pediatrician regarding the use of this medicine in children. While this drug may be prescribed for selected conditions, precautions do apply. °Overdosage: If you think you have taken too much of this medicine contact a poison control center or emergency room at once. °NOTE: This medicine is only for you. Do not share this medicine with others. °What if I miss a dose? °If you miss a dose, take it as soon as you can. If it is almost time for your next dose, take only that dose. Do not take double or extra doses. There should be at least 4 to 6 hours between doses. °What may interact with this medicine? °· probenecid °· some other antibiotics °This list may not describe all possible interactions. Give your health care provider a  list of all the medicines, herbs, non-prescription drugs, or dietary supplements you use. Also tell them if you smoke, drink alcohol, or use illegal drugs. Some items may interact with your medicine. °What should I watch for while using this medicine? °Tell your doctor or health care provider if your symptoms do not begin to improve in a few days. °This medicine may cause serious skin reactions. They can happen weeks to months after starting the medicine. Contact your health care provider right away if you notice fevers or flu-like symptoms with a rash. The rash may be red or purple and then turn into blisters or peeling of the skin. Or, you might notice a red rash with swelling of the face, lips or lymph nodes in your neck or under your arms. °Do not treat diarrhea with over the counter products. Contact your doctor if you have diarrhea that lasts more than 2 days or if it is severe and watery. °If you have diabetes, you may get a false-positive result for sugar in your urine. Check with your doctor or health care provider. °What side effects may I notice from receiving this medicine? °Side effects that you should report to your doctor or health care professional as soon as possible: °· allergic reactions like skin rash, itching or hives, swelling of the face, lips, or tongue °· breathing problems °· pain or trouble passing urine °· redness, blistering, peeling or loosening of the skin, including inside the mouth °· severe or watery diarrhea °· unusually weak or tired °· yellowing of the eyes, skin °Side   effects that usually do not require medical attention (report to your doctor or health care professional if they continue or are bothersome): °· gas or heartburn °· genital or anal irritation °· headache °· joint or muscle pain °· nausea, vomiting °This list may not describe all possible side effects. Call your doctor for medical advice about side effects. You may report side effects to FDA at  1-800-FDA-1088. °Where should I keep my medicine? °Keep out of the reach of children. °Store at room temperature between 59 and 86 degrees F (15 and 30 degrees C). Throw away any unused medicine after the expiration date. °NOTE: This sheet is a summary. It may not cover all possible information. If you have questions about this medicine, talk to your doctor, pharmacist, or health care provider. °© 2020 [employer]vier/Gold Standard (2018-04-04 07:00:28) ° °

## 2018-09-03 NOTE — Progress Notes (Signed)
  Pt presents for recurrence of left Bartholins cyst from 2 weeks ago.  She reports worsening swelling and pain over the last 2 days, feels it never completely resolved.  Last one before this was 20 years ago.  She took Amoxicillin as prescribed but feels it was not effective.  She had I&D 2 weeks ago here.  No fever.   PMHx: She  has a past medical history of GERD (gastroesophageal reflux disease), Hyperlipidemia, and Hypertension. Also,  has a past surgical history that includes Colonoscopy w/ polypectomy (11/06/2012, 07/14/1997) and Colonoscopy with propofol (N/A, 03/03/2018)., family history is not on file.,  reports that she has quit smoking. She has never used smokeless tobacco. She reports that she does not drink alcohol or use drugs.  She has a current medication list which includes the following prescription(s): aspirin ec, atenolol, cephalexin, pantoprazole, pravastatin, and ranitidine. Also, has No Known Allergies.  Review of Systems  All other systems reviewed and are negative.   Objective: BP 140/80   Ht 5\' 2"  (1.575 m)   Wt 159 lb (72.1 kg)   BMI 29.08 kg/m  Physical Exam Constitutional:      General: She is not in acute distress.    Appearance: She is well-developed.  Genitourinary:     Pelvic exam was performed with patient supine.     Vagina and uterus normal.     No vaginal erythema or bleeding.     No cervical motion tenderness, discharge, polyp or nabothian cyst.     Uterus is mobile.     Uterus is not enlarged.     No uterine mass detected.    Uterus is midaxial.     No right or left adnexal mass present.     Right adnexa not tender.     Left adnexa not tender.     Genitourinary Comments: Left 2cm Bartholins swelling w mild erythema and slight T to palpation, no drainage  HENT:     Head: Normocephalic and atraumatic.     Nose: Nose normal.  Abdominal:     General: There is no distension.     Palpations: Abdomen is soft.     Tenderness: There is no abdominal  tenderness.  Musculoskeletal: Normal range of motion.  Neurological:     Mental Status: She is alert and oriented to person, place, and time.     Cranial Nerves: No cranial nerve deficit.  Skin:    General: Skin is warm and dry.     ASSESSMENT/PLAN:    Problem List Items Addressed This Visit      Bartholin's gland abscess    -  Primary   Relevant Medications   cephALEXin (KEFLEX) 500 MG capsule 4 times daily for 7 days    Plan Keflex Abx as preffered medicinal therapy and follow up 48 hours.  If improved then can continue, and even schedule Marsupilization if pt prefers procedure to prevent recurrence (although since it has been 20 years, OK to wait on this). If not improved then consider I&D again.  Barnett Applebaum, MD, [facility], [facility] 09/03/2018  4:44 PM

## 2018-09-05 ENCOUNTER — Encounter: Payer: Self-pay | Admitting: Obstetrics and Gynecology

## 2018-09-05 ENCOUNTER — Ambulatory Visit: Payer: Medicare Other | Admitting: Obstetrics and Gynecology

## 2018-09-05 ENCOUNTER — Other Ambulatory Visit: Payer: Self-pay

## 2018-09-05 ENCOUNTER — Ambulatory Visit (INDEPENDENT_AMBULATORY_CARE_PROVIDER_SITE_OTHER): Payer: Medicare Other | Admitting: Obstetrics and Gynecology

## 2018-09-05 VITALS — BP 110/70 | Ht 62.0 in | Wt 159.0 lb

## 2018-09-05 DIAGNOSIS — N751 Abscess of Bartholin's gland: Secondary | ICD-10-CM | POA: Diagnosis not present

## 2018-09-05 NOTE — Progress Notes (Signed)
Patient ID: Karen Harvey, female   DOB: 12/17/1940, 78 y.o.   MRN: [id number]  Reason for Consult: Follow-up (Abscess opened on its own last night and is still draining )   Referred by Glendon Axe, MD  Subjective:     HPI:  Karen Harvey is a 78 y.o. female  She is being followed for a bartholin gland abscess. It was drained on 08/19/2018 and she was placed on Augmentin. She did not follow up as instructed. She presented to the office on 09/03/2018 with complaints of it reoccurring. She was seen by Dr. Kenton Kingfisher and started on Keflex.  She reports today that it opened last night and started draining purulent fluid and blood.  She has no additional complaints of fever or pain.  She is continuing an active lifestyle and reports mowing the lawn yesterday.   Past Medical History:  Diagnosis Date  . GERD (gastroesophageal reflux disease)   . Hyperlipidemia   . Hypertension    History reviewed. No pertinent family history. Past Surgical History:  Procedure Laterality Date  . COLONOSCOPY W/ POLYPECTOMY  11/06/2012, 07/14/1997   adenomatous polyp  . COLONOSCOPY WITH PROPOFOL N/A 03/03/2018   Procedure: COLONOSCOPY WITH PROPOFOL;  Surgeon: Manya Silvas, MD;  Location: [facility] ENDOSCOPY;  Service: Endoscopy;  Laterality: N/A;    Short Social History:  Social History   Tobacco Use  . Smoking status: Former Research scientist (life sciences)  . Smokeless tobacco: Never Used  Substance Use Topics  . Alcohol use: Never    Frequency: Never    No Known Allergies  Current Outpatient Medications  Medication Sig Dispense Refill  . aspirin EC 81 MG tablet Take 81 mg by mouth daily.    Marland Kitchen atenolol (TENORMIN) 25 MG tablet Take 12.5 mg by mouth daily. 0.5 tablet oral daily.    . cephALEXin (KEFLEX) 500 MG capsule Take 1 capsule (500 mg total) by mouth 4 (four) times daily. 28 capsule 2  . fluticasone (FLONASE) 50 MCG/ACT nasal spray fluticasone propionate 50 mcg/actuation nasal spray,suspension  SPRAY 2 SPRAYS INTO  EACH NOSTRIL EVERY DAY    . pantoprazole (PROTONIX) 40 MG tablet Take 40 mg by mouth daily.    . pravastatin (PRAVACHOL) 20 MG tablet Take 20 mg by mouth at bedtime.    . ranitidine (ZANTAC) 150 MG tablet Take 150 mg by mouth 2 (two) times daily.    . valACYclovir (VALTREX) 1000 MG tablet valacyclovir 1 gram tablet  TAKE 1 TABLET BY MOUTH THREE TIMES A DAY     No current facility-administered medications for this visit.     Review of Systems  Constitutional: Negative for chills, fatigue, fever and unexpected weight change.  HENT: Negative for trouble swallowing.  Eyes: Negative for loss of vision.  Respiratory: Negative for cough, shortness of breath and wheezing.  Cardiovascular: Negative for chest pain, leg swelling, palpitations and syncope.  GI: Negative for abdominal pain, blood in stool, diarrhea, nausea and vomiting.  GU: Negative for difficulty urinating, dysuria, frequency and hematuria.  Musculoskeletal: Negative for back pain, leg pain and joint pain.  Skin: Negative for rash.  Neurological: Negative for dizziness, headaches, light-headedness, numbness and seizures.  Psychiatric: Negative for behavioral problem, confusion, depressed mood and sleep disturbance.        Objective:  Objective   Vitals:   09/05/18 0953  BP: 110/70  Weight: 159 lb (72.1 kg)  Height: 5\' 2"  (1.575 m)   Body mass index is 29.08 kg/m.  Physical Exam Vitals signs  and nursing note reviewed. Exam conducted with a chaperone present.  Constitutional:      Appearance: She is well-developed.  HENT:     Head: Normocephalic and atraumatic.  Eyes:     Pupils: Pupils are equal, round, and reactive to light.  Cardiovascular:     Rate and Rhythm: Normal rate and regular rhythm.  Pulmonary:     Effort: Pulmonary effort is normal. No respiratory distress.  Genitourinary:      Comments: Left sided induration of 3cm in labia at site of bartholin gland abscess. Drainage of serosanguineous fluid  with palpation from prior incision site. Improved erythema from the 11th.  Skin:    General: Skin is warm and dry.  Neurological:     Mental Status: She is alert and oriented to person, place, and time.  Psychiatric:        Behavior: Behavior normal.        Thought Content: Thought content normal.        Judgment: Judgment normal.          Assessment/Plan:     78 yo with infected bartholin gland abscess.  Improved today from the 11th. Continue to monitor closely. Continue oral antibiotics  Follow up in 1 week.   More than 10 minutes were spent face to face with the patient in the room with more than 50% of the time spent providing counseling and discussing the plan of management.     Adrian Prows MD [facility], [facility] 09/05/2018 10:26 AM

## 2018-09-12 ENCOUNTER — Ambulatory Visit: Payer: Medicare Other | Admitting: Obstetrics and Gynecology

## 2019-01-29 DIAGNOSIS — E785 Hyperlipidemia, unspecified: Secondary | ICD-10-CM | POA: Diagnosis not present

## 2019-01-29 DIAGNOSIS — G4700 Insomnia, unspecified: Secondary | ICD-10-CM | POA: Diagnosis not present

## 2019-01-29 DIAGNOSIS — M170 Bilateral primary osteoarthritis of knee: Secondary | ICD-10-CM | POA: Diagnosis not present

## 2019-01-29 DIAGNOSIS — K219 Gastro-esophageal reflux disease without esophagitis: Secondary | ICD-10-CM | POA: Diagnosis not present

## 2019-01-29 DIAGNOSIS — Z0000 Encounter for general adult medical examination without abnormal findings: Secondary | ICD-10-CM | POA: Diagnosis not present

## 2019-01-29 DIAGNOSIS — M8949 Other hypertrophic osteoarthropathy, multiple sites: Secondary | ICD-10-CM | POA: Diagnosis not present

## 2019-01-29 DIAGNOSIS — M150 Primary generalized (osteo)arthritis: Secondary | ICD-10-CM | POA: Diagnosis not present

## 2019-01-29 DIAGNOSIS — I10 Essential (primary) hypertension: Secondary | ICD-10-CM | POA: Diagnosis not present

## 2019-02-02 DIAGNOSIS — M1712 Unilateral primary osteoarthritis, left knee: Secondary | ICD-10-CM | POA: Diagnosis not present

## 2019-02-13 DIAGNOSIS — H2511 Age-related nuclear cataract, right eye: Secondary | ICD-10-CM | POA: Diagnosis not present

## 2019-02-13 DIAGNOSIS — H02055 Trichiasis without entropian left lower eyelid: Secondary | ICD-10-CM | POA: Diagnosis not present

## 2019-02-20 DIAGNOSIS — M1712 Unilateral primary osteoarthritis, left knee: Secondary | ICD-10-CM | POA: Diagnosis not present

## 2019-02-20 DIAGNOSIS — Z87891 Personal history of nicotine dependence: Secondary | ICD-10-CM | POA: Diagnosis not present

## 2019-02-20 DIAGNOSIS — Z79899 Other long term (current) drug therapy: Secondary | ICD-10-CM | POA: Diagnosis not present

## 2019-02-20 DIAGNOSIS — G4700 Insomnia, unspecified: Secondary | ICD-10-CM | POA: Diagnosis not present

## 2019-02-20 DIAGNOSIS — M1611 Unilateral primary osteoarthritis, right hip: Secondary | ICD-10-CM | POA: Diagnosis not present

## 2019-02-20 DIAGNOSIS — E785 Hyperlipidemia, unspecified: Secondary | ICD-10-CM | POA: Diagnosis not present

## 2019-02-20 DIAGNOSIS — I10 Essential (primary) hypertension: Secondary | ICD-10-CM | POA: Diagnosis not present

## 2019-02-20 DIAGNOSIS — K219 Gastro-esophageal reflux disease without esophagitis: Secondary | ICD-10-CM | POA: Diagnosis not present

## 2019-02-25 DIAGNOSIS — I10 Essential (primary) hypertension: Secondary | ICD-10-CM | POA: Diagnosis not present

## 2019-02-25 DIAGNOSIS — H2511 Age-related nuclear cataract, right eye: Secondary | ICD-10-CM | POA: Diagnosis not present

## 2019-03-04 ENCOUNTER — Other Ambulatory Visit: Payer: Self-pay

## 2019-03-04 ENCOUNTER — Encounter: Payer: Self-pay | Admitting: Ophthalmology

## 2019-03-06 ENCOUNTER — Other Ambulatory Visit: Payer: Self-pay

## 2019-03-06 ENCOUNTER — Other Ambulatory Visit
Admission: RE | Admit: 2019-03-06 | Discharge: 2019-03-06 | Disposition: A | Discharge status: Home / Self Care | Payer: Medicare HMO | Source: Ambulatory Visit | Attending: Ophthalmology | Admitting: Ophthalmology

## 2019-03-06 DIAGNOSIS — Z20822 Contact with and (suspected) exposure to covid-19: Secondary | ICD-10-CM | POA: Insufficient documentation

## 2019-03-06 DIAGNOSIS — Z01812 Encounter for preprocedural laboratory examination: Secondary | ICD-10-CM | POA: Diagnosis not present

## 2019-03-06 LAB — SARS CORONAVIRUS 2 (TAT 6-24 HRS): SARS Coronavirus 2: NEGATIVE

## 2019-03-06 NOTE — Discharge Instructions (Signed)

## 2019-03-10 ENCOUNTER — Ambulatory Visit
Admission: RE | Admit: 2019-03-10 | Discharge: 2019-03-10 | Disposition: A | Discharge status: Home / Self Care | Payer: Medicare HMO | Attending: Ophthalmology | Admitting: Ophthalmology

## 2019-03-10 ENCOUNTER — Other Ambulatory Visit: Payer: Self-pay

## 2019-03-10 ENCOUNTER — Encounter
Admission: RE | Disposition: A | Discharge status: Home / Self Care | Payer: Self-pay | Source: Home / Self Care | Attending: Ophthalmology

## 2019-03-10 ENCOUNTER — Ambulatory Visit: Payer: Medicare HMO | Admitting: Anesthesiology

## 2019-03-10 ENCOUNTER — Encounter: Payer: Self-pay | Admitting: Ophthalmology

## 2019-03-10 DIAGNOSIS — I10 Essential (primary) hypertension: Secondary | ICD-10-CM | POA: Insufficient documentation

## 2019-03-10 DIAGNOSIS — E785 Hyperlipidemia, unspecified: Secondary | ICD-10-CM | POA: Diagnosis not present

## 2019-03-10 DIAGNOSIS — H2511 Age-related nuclear cataract, right eye: Secondary | ICD-10-CM | POA: Diagnosis not present

## 2019-03-10 DIAGNOSIS — H25811 Combined forms of age-related cataract, right eye: Secondary | ICD-10-CM | POA: Diagnosis not present

## 2019-03-10 DIAGNOSIS — K219 Gastro-esophageal reflux disease without esophagitis: Secondary | ICD-10-CM | POA: Insufficient documentation

## 2019-03-10 HISTORY — PX: CATARACT EXTRACTION W/PHACO: SHX586

## 2019-03-10 SURGERY — PHACOEMULSIFICATION, CATARACT, WITH IOL INSERTION
Anesthesia: Monitor Anesthesia Care | Site: Eye | Laterality: Right

## 2019-03-10 MED ORDER — MOXIFLOXACIN HCL 0.5 % OP SOLN
OPHTHALMIC | Status: DC | PRN
  Administered 2019-03-10: 0.2 mL via OPHTHALMIC

## 2019-03-10 MED ORDER — FENTANYL CITRATE (PF) 100 MCG/2ML IJ SOLN
INTRAMUSCULAR | Status: DC | PRN
  Administered 2019-03-10: 50 ug via INTRAVENOUS

## 2019-03-10 MED ORDER — EPINEPHRINE PF 1 MG/ML IJ SOLN
INTRAOCULAR | Status: DC | PRN
  Administered 2019-03-10: 10:00:00 57 mL via OPHTHALMIC

## 2019-03-10 MED ORDER — MIDAZOLAM HCL 2 MG/2ML IJ SOLN
INTRAMUSCULAR | Status: DC | PRN
  Administered 2019-03-10: 1 mg via INTRAVENOUS

## 2019-03-10 MED ORDER — BRIMONIDINE TARTRATE-TIMOLOL 0.2-0.5 % OP SOLN
OPHTHALMIC | Status: DC | PRN
  Administered 2019-03-10: 1 [drp] via OPHTHALMIC

## 2019-03-10 MED ORDER — LACTATED RINGERS IV SOLN: INTRAVENOUS | Status: DC

## 2019-03-10 MED ORDER — NA CHONDROIT SULF-NA HYALURON 40-17 MG/ML IO SOLN
INTRAOCULAR | Status: DC | PRN
  Administered 2019-03-10: 1 mL via INTRAOCULAR

## 2019-03-10 MED ORDER — TETRACAINE HCL 0.5 % OP SOLN
1.0000 [drp] | OPHTHALMIC | Status: DC | PRN
  Administered 2019-03-10 (×3): 1 [drp] via OPHTHALMIC

## 2019-03-10 MED ORDER — ARMC OPHTHALMIC DILATING DROPS
1.0000 "application " | OPHTHALMIC | Status: DC | PRN
  Administered 2019-03-10 (×3): 1 via OPHTHALMIC

## 2019-03-10 MED ORDER — LIDOCAINE HCL (PF) 2 % IJ SOLN
INTRAOCULAR | Status: DC | PRN
  Administered 2019-03-10: 1 mL

## 2019-03-10 SURGICAL SUPPLY — 21 items
CANNULA ANT/CHMB 27G (MISCELLANEOUS) ×2 IMPLANT
CANNULA ANT/CHMB 27GA (MISCELLANEOUS) ×6 IMPLANT
GLOVE SURG LX 8.0 MICRO (GLOVE) ×2
GLOVE SURG LX STRL 8.0 MICRO (GLOVE) ×1 IMPLANT
GLOVE SURG TRIUMPH 8.0 PF LTX (GLOVE) ×3 IMPLANT
GOWN STRL REUS W/ TWL LRG LVL3 (GOWN DISPOSABLE) ×2 IMPLANT
GOWN STRL REUS W/TWL LRG LVL3 (GOWN DISPOSABLE) ×4
LENS IOL DIOP 20.5 (Intraocular Lens) ×3 IMPLANT
LENS IOL TECNIS MONO 20.5 (Intraocular Lens) IMPLANT
MARKER SKIN DUAL TIP RULER LAB (MISCELLANEOUS) ×3 IMPLANT
NDL FILTER BLUNT 18X1 1/2 (NEEDLE) ×1 IMPLANT
NDL RETROBULBAR .5 NSTRL (NEEDLE) ×3 IMPLANT
NEEDLE FILTER BLUNT 18X 1/2SAF (NEEDLE) ×2
NEEDLE FILTER BLUNT 18X1 1/2 (NEEDLE) ×1 IMPLANT
PACK EYE AFTER SURG (MISCELLANEOUS) ×3 IMPLANT
PACK OPTHALMIC (MISCELLANEOUS) ×3 IMPLANT
PACK PORFILIO (MISCELLANEOUS) ×3 IMPLANT
SYR 3ML LL SCALE MARK (SYRINGE) ×3 IMPLANT
SYR TB 1ML LUER SLIP (SYRINGE) ×3 IMPLANT
WATER STERILE IRR 250ML POUR (IV SOLUTION) ×3 IMPLANT
WIPE NON LINTING 3.25X3.25 (MISCELLANEOUS) ×3 IMPLANT

## 2019-03-10 NOTE — H&P (Signed)
All labs reviewed. Abnormal studies sent to patients PCP when indicated.  Previous H&P reviewed, patient examined, there are NO CHANGES.  Arden Axon Porfilio3/2/20219:50 AM

## 2019-03-10 NOTE — Anesthesia Preprocedure Evaluation (Signed)
Anesthesia Evaluation  Patient identified by MRN, date of birth, ID band Patient awake    Reviewed: Allergy & Precautions, NPO status   Airway Mallampati: II  TM Distance: >3 FB     Dental   Pulmonary    breath sounds clear to auscultation       Cardiovascular hypertension,  Rhythm:Regular Rate:Normal  HLD   Neuro/Psych    GI/Hepatic GERD  ,  Endo/Other    Renal/GU      Musculoskeletal   Abdominal   Peds  Hematology   Anesthesia Other Findings   Reproductive/Obstetrics                             Anesthesia Physical Anesthesia Plan  ASA: II  Anesthesia Plan: MAC   Post-op Pain Management:    Induction: Intravenous  PONV Risk Score and Plan: TIVA, Midazolam and Treatment may vary due to age or medical condition  Airway Management Planned: Natural Airway and Nasal Cannula  Additional Equipment:   Intra-op Plan:   Post-operative Plan:   Informed Consent: I have reviewed the patients History and Physical, chart, labs and discussed the procedure including the risks, benefits and alternatives for the proposed anesthesia with the patient or authorized representative who has indicated his/her understanding and acceptance.       Plan Discussed with: CRNA  Anesthesia Plan Comments:         Anesthesia Quick Evaluation

## 2019-03-10 NOTE — Transfer of Care (Signed)
Immediate Anesthesia Transfer of Care Note  Patient: Karen Harvey  Procedure(s) Performed: CATARACT EXTRACTION PHACO AND INTRAOCULAR LENS PLACEMENT (IOC) RIGHT 9.87  01:01.6 (Right Eye)  Patient Location: PACU  Anesthesia Type: MAC  Level of Consciousness: awake, alert  and patient cooperative  Airway and Oxygen Therapy: Patient Spontanous Breathing and Patient connected to supplemental oxygen  Post-op Assessment: Post-op Vital signs reviewed, Patient's Cardiovascular Status Stable, Respiratory Function Stable, Patent Airway and No signs of Nausea or vomiting  Post-op Vital Signs: Reviewed and stable  Complications: No apparent anesthesia complications

## 2019-03-10 NOTE — Op Note (Signed)
PREOPERATIVE DIAGNOSIS:  Nuclear sclerotic cataract of the right eye.   POSTOPERATIVE DIAGNOSIS:  H25.11 Cataract   OPERATIVE PROCEDURE:@   SURGEON:  Birder Robson, MD.   ANESTHESIA:  Anesthesiologist: Veda Canning, MD CRNA: Silvana Newness, CRNA  1.      Managed anesthesia care. 2.      0.26ml of Shugarcaine was instilled in the eye following the paracentesis.   COMPLICATIONS:  None.   TECHNIQUE:   Stop and chop   DESCRIPTION OF PROCEDURE:  The patient was examined and consented in the preoperative holding area where the aforementioned topical anesthesia was applied to the right eye and then brought back to the Operating Room where the right eye was prepped and draped in the usual sterile ophthalmic fashion and a lid speculum was placed. A paracentesis was created with the side port blade and the anterior chamber was filled with viscoelastic. A near clear corneal incision was performed with the steel keratome. A continuous curvilinear capsulorrhexis was performed with a cystotome followed by the capsulorrhexis forceps. Hydrodissection and hydrodelineation were carried out with BSS on a blunt cannula. The lens was removed in a stop and chop  technique and the remaining cortical material was removed with the irrigation-aspiration handpiece. The capsular bag was inflated with viscoelastic and the Technis [id number]  lens was placed in the capsular bag without complication. The remaining viscoelastic was removed from the eye with the irrigation-aspiration handpiece. The wounds were hydrated. The anterior chamber was flushed with BSS and the eye was inflated to physiologic pressure. 0.63ml of Vigamox was placed in the anterior chamber. The wounds were found to be water tight. The eye was dressed with Combigan. The patient was given protective glasses to wear throughout the day and a shield with which to sleep tonight. The patient was also given drops with which to begin a drop regimen today and will  follow-up with me in one day. Implant Name Type Inv. Item Serial No. Manufacturer Lot No. LRB No. Used Action  LENS IOL DIOP 20.5 - [id number] Intraocular Lens LENS IOL DIOP 20.5 [id number]  Right 1 Implanted   Procedure(s): CATARACT EXTRACTION PHACO AND INTRAOCULAR LENS PLACEMENT (IOC) RIGHT 9.87  01:01.6 (Right)  Electronically signed: Birder Robson 03/10/2019 10:25 AM

## 2019-03-10 NOTE — Anesthesia Postprocedure Evaluation (Signed)
Anesthesia Post Note  Patient: Karen Harvey  Procedure(s) Performed: CATARACT EXTRACTION PHACO AND INTRAOCULAR LENS PLACEMENT (IOC) RIGHT 9.87  01:01.6 (Right Eye)     Patient location during evaluation: PACU Anesthesia Type: MAC Level of consciousness: awake Pain management: pain level controlled Vital Signs Assessment: post-procedure vital signs reviewed and stable Respiratory status: respiratory function stable Cardiovascular status: stable Postop Assessment: no apparent nausea or vomiting Anesthetic complications: no    Veda Canning

## 2019-04-19 ENCOUNTER — Ambulatory Visit: Payer: Medicare HMO

## 2019-04-19 ENCOUNTER — Other Ambulatory Visit: Payer: Self-pay

## 2019-04-19 ENCOUNTER — Ambulatory Visit: Payer: Medicare HMO | Attending: Internal Medicine

## 2019-04-19 DIAGNOSIS — Z23 Encounter for immunization: Secondary | ICD-10-CM

## 2019-04-19 NOTE — Progress Notes (Signed)
   Covid-19 Vaccination Clinic  Name:  Karen Harvey    MRN: [id number] DOB: 1940/07/08  04/19/2019  [name] was observed post Covid-19 immunization for 15 minutes without incident. She was provided with Vaccine Information Sheet and instruction to access the V-Safe system.   [name] was instructed to call 911 with any severe reactions post vaccine: Marland Kitchen Difficulty breathing  . Swelling of face and throat  . A fast heartbeat  . A bad rash all over body  . Dizziness and weakness   Immunizations Administered    Name Date Dose VIS Date Route   Pfizer COVID-19 Vaccine 04/19/2019  6:06 PM 0.3 mL 12/19/2018 Intramuscular   Manufacturer: [place]   Lot: [id number]   [place]: [id number]

## 2019-04-27 DIAGNOSIS — M1712 Unilateral primary osteoarthritis, left knee: Secondary | ICD-10-CM | POA: Diagnosis not present

## 2019-05-12 ENCOUNTER — Ambulatory Visit: Payer: Medicare HMO

## 2019-05-12 DIAGNOSIS — M1712 Unilateral primary osteoarthritis, left knee: Secondary | ICD-10-CM | POA: Diagnosis not present

## 2019-05-12 DIAGNOSIS — S76011A Strain of muscle, fascia and tendon of right hip, initial encounter: Secondary | ICD-10-CM | POA: Diagnosis not present

## 2019-07-16 DIAGNOSIS — H02055 Trichiasis without entropian left lower eyelid: Secondary | ICD-10-CM | POA: Diagnosis not present

## 2019-07-22 DIAGNOSIS — M25562 Pain in left knee: Secondary | ICD-10-CM | POA: Diagnosis not present

## 2019-07-22 DIAGNOSIS — M25561 Pain in right knee: Secondary | ICD-10-CM | POA: Diagnosis not present

## 2019-07-22 DIAGNOSIS — M170 Bilateral primary osteoarthritis of knee: Secondary | ICD-10-CM | POA: Diagnosis not present

## 2019-07-23 DIAGNOSIS — F5104 Psychophysiologic insomnia: Secondary | ICD-10-CM | POA: Diagnosis not present

## 2019-07-23 DIAGNOSIS — E782 Mixed hyperlipidemia: Secondary | ICD-10-CM | POA: Diagnosis not present

## 2019-07-23 DIAGNOSIS — R7309 Other abnormal glucose: Secondary | ICD-10-CM | POA: Diagnosis not present

## 2019-07-23 DIAGNOSIS — I10 Essential (primary) hypertension: Secondary | ICD-10-CM | POA: Diagnosis not present

## 2019-07-23 DIAGNOSIS — R8290 Unspecified abnormal findings in urine: Secondary | ICD-10-CM | POA: Diagnosis not present

## 2019-07-23 DIAGNOSIS — M8949 Other hypertrophic osteoarthropathy, multiple sites: Secondary | ICD-10-CM | POA: Diagnosis not present

## 2019-07-23 DIAGNOSIS — Z0000 Encounter for general adult medical examination without abnormal findings: Secondary | ICD-10-CM | POA: Diagnosis not present

## 2019-07-30 DIAGNOSIS — Z79899 Other long term (current) drug therapy: Secondary | ICD-10-CM | POA: Diagnosis not present

## 2019-07-30 DIAGNOSIS — Z0000 Encounter for general adult medical examination without abnormal findings: Secondary | ICD-10-CM | POA: Diagnosis not present

## 2019-07-30 DIAGNOSIS — M1712 Unilateral primary osteoarthritis, left knee: Secondary | ICD-10-CM | POA: Diagnosis not present

## 2019-07-30 DIAGNOSIS — G4700 Insomnia, unspecified: Secondary | ICD-10-CM | POA: Diagnosis not present

## 2019-07-30 DIAGNOSIS — I10 Essential (primary) hypertension: Secondary | ICD-10-CM | POA: Diagnosis not present

## 2019-07-30 DIAGNOSIS — K219 Gastro-esophageal reflux disease without esophagitis: Secondary | ICD-10-CM | POA: Diagnosis not present

## 2019-10-07 DIAGNOSIS — H2512 Age-related nuclear cataract, left eye: Secondary | ICD-10-CM | POA: Diagnosis not present

## 2019-10-26 DIAGNOSIS — M1712 Unilateral primary osteoarthritis, left knee: Secondary | ICD-10-CM | POA: Diagnosis not present

## 2019-10-26 DIAGNOSIS — K219 Gastro-esophageal reflux disease without esophagitis: Secondary | ICD-10-CM | POA: Diagnosis not present

## 2019-10-26 DIAGNOSIS — I10 Essential (primary) hypertension: Secondary | ICD-10-CM | POA: Diagnosis not present

## 2019-10-26 DIAGNOSIS — L02412 Cutaneous abscess of left axilla: Secondary | ICD-10-CM | POA: Diagnosis not present

## 2019-11-25 DIAGNOSIS — R739 Hyperglycemia, unspecified: Secondary | ICD-10-CM | POA: Diagnosis not present

## 2019-11-25 DIAGNOSIS — K219 Gastro-esophageal reflux disease without esophagitis: Secondary | ICD-10-CM | POA: Diagnosis not present

## 2019-11-25 DIAGNOSIS — R002 Palpitations: Secondary | ICD-10-CM | POA: Diagnosis not present

## 2019-11-25 DIAGNOSIS — I10 Essential (primary) hypertension: Secondary | ICD-10-CM | POA: Diagnosis not present

## 2019-11-25 DIAGNOSIS — M8949 Other hypertrophic osteoarthropathy, multiple sites: Secondary | ICD-10-CM | POA: Diagnosis not present

## 2019-11-25 DIAGNOSIS — F5104 Psychophysiologic insomnia: Secondary | ICD-10-CM | POA: Diagnosis not present

## 2019-12-02 DIAGNOSIS — M1712 Unilateral primary osteoarthritis, left knee: Secondary | ICD-10-CM | POA: Diagnosis not present

## 2019-12-02 DIAGNOSIS — I10 Essential (primary) hypertension: Secondary | ICD-10-CM | POA: Diagnosis not present

## 2019-12-02 DIAGNOSIS — G4700 Insomnia, unspecified: Secondary | ICD-10-CM | POA: Diagnosis not present

## 2019-12-02 DIAGNOSIS — Z79899 Other long term (current) drug therapy: Secondary | ICD-10-CM | POA: Diagnosis not present

## 2020-02-04 DIAGNOSIS — M1712 Unilateral primary osteoarthritis, left knee: Secondary | ICD-10-CM | POA: Diagnosis not present

## 2020-02-04 DIAGNOSIS — M1711 Unilateral primary osteoarthritis, right knee: Secondary | ICD-10-CM | POA: Diagnosis not present

## 2020-03-21 DIAGNOSIS — M170 Bilateral primary osteoarthritis of knee: Secondary | ICD-10-CM | POA: Diagnosis not present

## 2020-03-29 DIAGNOSIS — I10 Essential (primary) hypertension: Secondary | ICD-10-CM | POA: Diagnosis not present

## 2020-03-29 DIAGNOSIS — R739 Hyperglycemia, unspecified: Secondary | ICD-10-CM | POA: Diagnosis not present

## 2020-03-29 DIAGNOSIS — M1712 Unilateral primary osteoarthritis, left knee: Secondary | ICD-10-CM | POA: Diagnosis not present

## 2020-04-06 DIAGNOSIS — Z79899 Other long term (current) drug therapy: Secondary | ICD-10-CM | POA: Diagnosis not present

## 2020-04-06 DIAGNOSIS — I10 Essential (primary) hypertension: Secondary | ICD-10-CM | POA: Diagnosis not present

## 2020-04-06 DIAGNOSIS — M170 Bilateral primary osteoarthritis of knee: Secondary | ICD-10-CM | POA: Diagnosis not present

## 2020-04-06 DIAGNOSIS — Z0000 Encounter for general adult medical examination without abnormal findings: Secondary | ICD-10-CM | POA: Diagnosis not present

## 2020-04-06 DIAGNOSIS — E785 Hyperlipidemia, unspecified: Secondary | ICD-10-CM | POA: Diagnosis not present

## 2020-04-08 DIAGNOSIS — M170 Bilateral primary osteoarthritis of knee: Secondary | ICD-10-CM | POA: Diagnosis not present

## 2020-04-08 DIAGNOSIS — M1711 Unilateral primary osteoarthritis, right knee: Secondary | ICD-10-CM | POA: Diagnosis not present

## 2020-04-08 DIAGNOSIS — M1712 Unilateral primary osteoarthritis, left knee: Secondary | ICD-10-CM | POA: Diagnosis not present

## 2020-04-22 DIAGNOSIS — M170 Bilateral primary osteoarthritis of knee: Secondary | ICD-10-CM | POA: Diagnosis not present

## 2020-08-22 DIAGNOSIS — H02055 Trichiasis without entropian left lower eyelid: Secondary | ICD-10-CM | POA: Diagnosis not present

## 2020-08-29 DIAGNOSIS — E782 Mixed hyperlipidemia: Secondary | ICD-10-CM | POA: Diagnosis not present

## 2020-08-29 DIAGNOSIS — R8290 Unspecified abnormal findings in urine: Secondary | ICD-10-CM | POA: Diagnosis not present

## 2020-08-29 DIAGNOSIS — E7800 Pure hypercholesterolemia, unspecified: Secondary | ICD-10-CM | POA: Diagnosis not present

## 2020-08-29 DIAGNOSIS — Z0000 Encounter for general adult medical examination without abnormal findings: Secondary | ICD-10-CM | POA: Diagnosis not present

## 2020-08-29 DIAGNOSIS — I10 Essential (primary) hypertension: Secondary | ICD-10-CM | POA: Diagnosis not present

## 2020-09-05 DIAGNOSIS — G4700 Insomnia, unspecified: Secondary | ICD-10-CM | POA: Diagnosis not present

## 2020-09-05 DIAGNOSIS — I10 Essential (primary) hypertension: Secondary | ICD-10-CM | POA: Diagnosis not present

## 2020-09-05 DIAGNOSIS — M170 Bilateral primary osteoarthritis of knee: Secondary | ICD-10-CM | POA: Diagnosis not present

## 2020-09-05 DIAGNOSIS — Z79899 Other long term (current) drug therapy: Secondary | ICD-10-CM | POA: Diagnosis not present

## 2020-09-05 DIAGNOSIS — E785 Hyperlipidemia, unspecified: Secondary | ICD-10-CM | POA: Diagnosis not present

## 2020-09-05 DIAGNOSIS — Z0000 Encounter for general adult medical examination without abnormal findings: Secondary | ICD-10-CM | POA: Diagnosis not present

## 2020-09-23 DIAGNOSIS — M1712 Unilateral primary osteoarthritis, left knee: Secondary | ICD-10-CM | POA: Diagnosis not present

## 2020-09-23 DIAGNOSIS — M170 Bilateral primary osteoarthritis of knee: Secondary | ICD-10-CM | POA: Diagnosis not present

## 2020-09-23 DIAGNOSIS — M1711 Unilateral primary osteoarthritis, right knee: Secondary | ICD-10-CM | POA: Diagnosis not present

## 2020-09-23 DIAGNOSIS — M1611 Unilateral primary osteoarthritis, right hip: Secondary | ICD-10-CM | POA: Diagnosis not present

## 2020-10-05 DIAGNOSIS — M170 Bilateral primary osteoarthritis of knee: Secondary | ICD-10-CM | POA: Diagnosis not present

## 2020-10-06 DIAGNOSIS — H2512 Age-related nuclear cataract, left eye: Secondary | ICD-10-CM | POA: Diagnosis not present

## 2020-11-04 ENCOUNTER — Other Ambulatory Visit (HOSPITAL_BASED_OUTPATIENT_CLINIC_OR_DEPARTMENT_OTHER): Payer: Self-pay | Admitting: Orthopedic Surgery

## 2020-11-04 ENCOUNTER — Other Ambulatory Visit: Payer: Self-pay | Admitting: Orthopedic Surgery

## 2020-11-04 DIAGNOSIS — M170 Bilateral primary osteoarthritis of knee: Secondary | ICD-10-CM | POA: Diagnosis not present

## 2020-11-04 DIAGNOSIS — M1711 Unilateral primary osteoarthritis, right knee: Secondary | ICD-10-CM

## 2020-11-11 ENCOUNTER — Ambulatory Visit
Admission: RE | Admit: 2020-11-11 | Discharge: 2020-11-11 | Disposition: A | Discharge status: Home / Self Care | Payer: Medicare HMO | Source: Ambulatory Visit | Attending: Orthopedic Surgery | Admitting: Orthopedic Surgery

## 2020-11-11 ENCOUNTER — Other Ambulatory Visit: Payer: Self-pay

## 2020-11-11 DIAGNOSIS — M1611 Unilateral primary osteoarthritis, right hip: Secondary | ICD-10-CM | POA: Diagnosis not present

## 2020-11-11 DIAGNOSIS — M25461 Effusion, right knee: Secondary | ICD-10-CM | POA: Diagnosis not present

## 2020-11-11 DIAGNOSIS — M1711 Unilateral primary osteoarthritis, right knee: Secondary | ICD-10-CM | POA: Insufficient documentation

## 2020-11-16 ENCOUNTER — Other Ambulatory Visit: Payer: Self-pay | Admitting: Orthopedic Surgery

## 2020-12-08 ENCOUNTER — Other Ambulatory Visit: Payer: Self-pay

## 2020-12-08 ENCOUNTER — Encounter
Admission: RE | Admit: 2020-12-08 | Discharge: 2020-12-08 | Disposition: A | Discharge status: Home / Self Care | Payer: Medicare HMO | Source: Ambulatory Visit | Attending: Orthopedic Surgery | Admitting: Orthopedic Surgery

## 2020-12-08 VITALS — BP 154/61 | HR 60 | Resp 16 | Ht 62.0 in | Wt 160.0 lb

## 2020-12-08 DIAGNOSIS — Z01818 Encounter for other preprocedural examination: Secondary | ICD-10-CM | POA: Diagnosis not present

## 2020-12-08 DIAGNOSIS — Z01810 Encounter for preprocedural cardiovascular examination: Secondary | ICD-10-CM | POA: Diagnosis not present

## 2020-12-08 HISTORY — DX: Unspecified osteoarthritis, unspecified site: M19.90

## 2020-12-08 HISTORY — DX: Anxiety disorder, unspecified: F41.9

## 2020-12-08 LAB — URINALYSIS, ROUTINE W REFLEX MICROSCOPIC
Bilirubin Urine: NEGATIVE
Glucose, UA: NEGATIVE mg/dL
Hgb urine dipstick: NEGATIVE
Ketones, ur: NEGATIVE mg/dL
Leukocytes,Ua: NEGATIVE
Nitrite: NEGATIVE
Protein, ur: NEGATIVE mg/dL
Specific Gravity, Urine: 1.013 (ref 1.005–1.030)
pH: 5 (ref 5.0–8.0)

## 2020-12-08 LAB — SURGICAL PCR SCREEN
MRSA, PCR: NEGATIVE
Staphylococcus aureus: POSITIVE — AB

## 2020-12-08 LAB — COMPREHENSIVE METABOLIC PANEL
ALT: 31 U/L (ref 0–44)
AST: 28 U/L (ref 15–41)
Albumin: 4.4 g/dL (ref 3.5–5.0)
Alkaline Phosphatase: 99 U/L (ref 38–126)
Anion gap: 3 — ABNORMAL LOW (ref 5–15)
BUN: 13 mg/dL (ref 8–23)
CO2: 26 mmol/L (ref 22–32)
Calcium: 10.9 mg/dL — ABNORMAL HIGH (ref 8.9–10.3)
Chloride: 107 mmol/L (ref 98–111)
Creatinine, Ser: 0.79 mg/dL (ref 0.44–1.00)
GFR, Estimated: >60 mL/min (ref >60)
Glucose, Bld: 97 mg/dL (ref 70–99)
Potassium: 4 mmol/L (ref 3.5–5.1)
Sodium: 136 mmol/L (ref 135–145)
Total Bilirubin: 0.9 mg/dL (ref 0.3–1.2)
Total Protein: 7.4 g/dL (ref 6.5–8.1)

## 2020-12-08 LAB — CBC WITH DIFFERENTIAL/PLATELET
Abs Immature Granulocytes: 0.04 10*3/uL (ref 0.00–0.07)
Basophils Absolute: 0.1 10*3/uL (ref 0.0–0.1)
Basophils Relative: 1 %
Eosinophils Absolute: 0.2 10*3/uL (ref 0.0–0.5)
Eosinophils Relative: 2 %
HCT: 45.3 % (ref 36.0–46.0)
Hemoglobin: 15.4 g/dL — ABNORMAL HIGH (ref 12.0–15.0)
Immature Granulocytes: 0 %
Lymphocytes Relative: 25 %
Lymphs Abs: 2.6 10*3/uL (ref 0.7–4.0)
MCH: 30.4 pg (ref 26.0–34.0)
MCHC: 34 g/dL (ref 30.0–36.0)
MCV: 89.5 fL (ref 80.0–100.0)
Monocytes Absolute: 0.8 10*3/uL (ref 0.1–1.0)
Monocytes Relative: 8 %
Neutro Abs: 6.5 10*3/uL (ref 1.7–7.7)
Neutrophils Relative %: 64 %
Platelets: 262 10*3/uL (ref 150–400)
RBC: 5.06 MIL/uL (ref 3.87–5.11)
RDW: 13.1 % (ref 11.5–15.5)
WBC: 10.3 10*3/uL (ref 4.0–10.5)
nRBC: 0 % (ref 0.0–0.2)

## 2020-12-08 LAB — TYPE AND SCREEN
ABO/RH(D): AB POS
Antibody Screen: NEGATIVE

## 2020-12-08 NOTE — Patient Instructions (Signed)
Your procedure is scheduled on: 12/16/20 Report to [place]. To find out your arrival time please call [phone] between 1PM - 3PM on 12/15/20.  Remember: Instructions that are not followed completely may result in serious medical risk, up to and including death, or upon the discretion of your surgeon and anesthesiologist your surgery may need to be rescheduled.     _X__ 1. Do not eat food after midnight the night before your procedure.                 No gum chewing or hard candies. You may drink clear liquids up to 2 hours                 before you are scheduled to arrive for your surgery- DO not drink clear                 liquids within 2 hours of the start of your surgery.                 Clear Liquids include:  water, apple juice without pulp, clear carbohydrate                 drink such as Clearfast or Gatorade, Black Coffee or Tea (Do not add                 anything to coffee or tea). Diabetics water only  DRINK THE ENSURE "CLEAR' PRE SURGERY DRINK 2 HOURS BEFORE ARRIVING FOR SURGERY  __X__2.  On the morning of surgery brush your teeth with toothpaste and water, you                 may rinse your mouth with mouthwash if you wish.  Do not swallow any              toothpaste of mouthwash.     _X__ 3.  No Alcohol for 24 hours before or after surgery.   _X__ 4.  Do Not Smoke or use e-cigarettes For 24 Hours Prior to Your Surgery.                 Do not use any chewable tobacco products for at least 6 hours prior to                 surgery.  ____  5.  Bring all medications with you on the day of surgery if instructed.   __X__  6.  Notify your doctor if there is any change in your medical condition      (cold, fever, infections).     Do not wear jewelry, make-up, hairpins, clips or nail polish. Do not wear lotions, powders, or perfumes.  Do not shave body hair 48 hours prior to surgery. Men may shave face and neck. Do  not bring valuables to the hospital.    [facility] is not responsible for any belongings or valuables.  Contacts, dentures/partials or body piercings may not be worn into surgery. Bring a case for your contacts, glasses or hearing aids, a denture cup will be supplied. Leave your suitcase in the car. After surgery it may be brought to your room. For patients admitted to the hospital, discharge time is determined by your treatment team.   Patients discharged the day of surgery will not be allowed to drive home.   Please read over the following fact sheets that you were given:   MRSA Information,  CHG SOAP, INCENTIVE SPIROMETER, ENSURE  __X__ Take these medicines the morning of surgery with A SIP OF WATER:    1. atenolol (TENORMIN) 25 MG tablet  2. pantoprazole (PROTONIX) 40 MG tablet  3.   4.  5.  6.  ____ Fleet Enema (as directed)   __X__ Use CHG Soap/SAGE wipes as directed  ____ Use inhalers on the day of surgery  ____ Stop metformin/Janumet/Farxiga 2 days prior to surgery    ____ Take 1/2 of usual insulin dose the night before surgery. No insulin the morning          of surgery.   ____ Stop Blood Thinners Coumadin/Plavix/Xarelto/Pleta/Pradaxa/Eliquis/Effient/Aspirin  on   Or contact your Surgeon, Cardiologist or Medical Doctor regarding  ability to stop your blood thinners  __X__ Stop Anti-inflammatories 7 days before surgery such as Advil, Ibuprofen, Motrin,  BC or Goodies Powder, Naprosyn, Naproxen, Aleve, Aspirin    __X__ Stop all herbals and supplements, fish oil or vitamins for 7 days until after surgery.    ____ Bring C-Pap to the hospital.

## 2020-12-14 ENCOUNTER — Other Ambulatory Visit
Admission: RE | Admit: 2020-12-14 | Discharge: 2020-12-14 | Disposition: A | Discharge status: Home / Self Care | Payer: Medicare HMO | Source: Ambulatory Visit | Attending: Orthopedic Surgery | Admitting: Orthopedic Surgery

## 2020-12-14 ENCOUNTER — Other Ambulatory Visit: Payer: Self-pay

## 2020-12-14 DIAGNOSIS — Z01812 Encounter for preprocedural laboratory examination: Secondary | ICD-10-CM | POA: Diagnosis not present

## 2020-12-14 DIAGNOSIS — Z20822 Contact with and (suspected) exposure to covid-19: Secondary | ICD-10-CM | POA: Insufficient documentation

## 2020-12-15 LAB — SARS CORONAVIRUS 2 (TAT 6-24 HRS): SARS Coronavirus 2: NEGATIVE

## 2020-12-15 MED ORDER — ORAL CARE MOUTH RINSE: 15.0000 mL | Freq: Once | OROMUCOSAL | Status: AC

## 2020-12-15 MED ORDER — LACTATED RINGERS IV SOLN: INTRAVENOUS | Status: DC

## 2020-12-15 MED ORDER — CHLORHEXIDINE GLUCONATE 0.12 % MT SOLN: 15.0000 mL | Freq: Once | OROMUCOSAL | Status: AC

## 2020-12-15 MED ORDER — CEFAZOLIN SODIUM-DEXTROSE 2-4 GM/100ML-% IV SOLN
2.0000 g | INTRAVENOUS | Status: AC
  Administered 2020-12-16: 2 g via INTRAVENOUS

## 2020-12-16 ENCOUNTER — Encounter: Payer: Self-pay | Admitting: Orthopedic Surgery

## 2020-12-16 ENCOUNTER — Encounter
Admission: RE | Disposition: A | Discharge status: Home / Self Care | Payer: Self-pay | Source: Home / Self Care | Attending: Orthopedic Surgery

## 2020-12-16 ENCOUNTER — Observation Stay
Admission: RE | Admit: 2020-12-16 | Discharge: 2020-12-19 | Disposition: A | Discharge status: Home Health | Payer: Medicare HMO | Attending: Orthopedic Surgery | Admitting: Orthopedic Surgery

## 2020-12-16 ENCOUNTER — Ambulatory Visit: Payer: Medicare HMO

## 2020-12-16 ENCOUNTER — Observation Stay: Payer: Medicare HMO

## 2020-12-16 ENCOUNTER — Other Ambulatory Visit: Payer: Self-pay

## 2020-12-16 DIAGNOSIS — I10 Essential (primary) hypertension: Secondary | ICD-10-CM | POA: Insufficient documentation

## 2020-12-16 DIAGNOSIS — R011 Cardiac murmur, unspecified: Secondary | ICD-10-CM | POA: Diagnosis not present

## 2020-12-16 DIAGNOSIS — Z79899 Other long term (current) drug therapy: Secondary | ICD-10-CM | POA: Insufficient documentation

## 2020-12-16 DIAGNOSIS — M1711 Unilateral primary osteoarthritis, right knee: Secondary | ICD-10-CM | POA: Diagnosis not present

## 2020-12-16 DIAGNOSIS — G8918 Other acute postprocedural pain: Secondary | ICD-10-CM

## 2020-12-16 DIAGNOSIS — M25561 Pain in right knee: Secondary | ICD-10-CM | POA: Diagnosis not present

## 2020-12-16 DIAGNOSIS — M7989 Other specified soft tissue disorders: Secondary | ICD-10-CM | POA: Diagnosis not present

## 2020-12-16 DIAGNOSIS — Z20822 Contact with and (suspected) exposure to covid-19: Secondary | ICD-10-CM | POA: Insufficient documentation

## 2020-12-16 DIAGNOSIS — Z96651 Presence of right artificial knee joint: Secondary | ICD-10-CM

## 2020-12-16 HISTORY — DX: Cardiac murmur, unspecified: R01.1

## 2020-12-16 HISTORY — PX: TOTAL KNEE ARTHROPLASTY: SHX125

## 2020-12-16 LAB — CBC
HCT: 41.8 % (ref 36.0–46.0)
Hemoglobin: 14.4 g/dL (ref 12.0–15.0)
MCH: 30.6 pg (ref 26.0–34.0)
MCHC: 34.4 g/dL (ref 30.0–36.0)
MCV: 88.7 fL (ref 80.0–100.0)
Platelets: 227 10*3/uL (ref 150–400)
RBC: 4.71 MIL/uL (ref 3.87–5.11)
RDW: 12.5 % (ref 11.5–15.5)
WBC: 9.5 10*3/uL (ref 4.0–10.5)
nRBC: 0 % (ref 0.0–0.2)

## 2020-12-16 LAB — CREATININE, SERUM
Creatinine, Ser: 0.74 mg/dL (ref 0.44–1.00)
GFR, Estimated: >60 mL/min (ref >60)

## 2020-12-16 LAB — ABO/RH: ABO/RH(D): AB POS

## 2020-12-16 SURGERY — ARTHROPLASTY, KNEE, TOTAL
Anesthesia: Spinal | Site: Knee | Laterality: Right

## 2020-12-16 MED ORDER — FENTANYL CITRATE (PF) 100 MCG/2ML IJ SOLN: 25.0000 ug | INTRAMUSCULAR | Status: DC | PRN

## 2020-12-16 MED ORDER — MORPHINE SULFATE (PF) 2 MG/ML IV SOLN: 0.5000 mg | INTRAVENOUS | Status: DC | PRN

## 2020-12-16 MED ORDER — SODIUM CHLORIDE 0.9 % IR SOLN
Status: DC | PRN
  Administered 2020-12-16: 1004 mL

## 2020-12-16 MED ORDER — TRAMADOL HCL 50 MG PO TABS
50.0000 mg | ORAL_TABLET | Freq: Four times a day (QID) | ORAL | Status: DC
  Administered 2020-12-16 – 2020-12-19 (×11): 50 mg via ORAL
  Filled 2020-12-16 (×12): qty 1

## 2020-12-16 MED ORDER — METHOCARBAMOL 1000 MG/10ML IJ SOLN
500.0000 mg | Freq: Four times a day (QID) | INTRAVENOUS | Status: DC | PRN
  Filled 2020-12-16: qty 5

## 2020-12-16 MED ORDER — EPHEDRINE 5 MG/ML INJ
INTRAVENOUS | Status: AC
  Filled 2020-12-16: qty 5

## 2020-12-16 MED ORDER — ASPIRIN EC 81 MG PO TBEC
81.0000 mg | DELAYED_RELEASE_TABLET | Freq: Every day | ORAL | Status: DC
  Administered 2020-12-16 – 2020-12-19 (×4): 81 mg via ORAL
  Filled 2020-12-16 (×4): qty 1

## 2020-12-16 MED ORDER — CEFAZOLIN SODIUM-DEXTROSE 2-4 GM/100ML-% IV SOLN
2.0000 g | Freq: Four times a day (QID) | INTRAVENOUS | Status: AC
  Administered 2020-12-16 (×2): 2 g via INTRAVENOUS
  Filled 2020-12-16 (×2): qty 100

## 2020-12-16 MED ORDER — CEFAZOLIN SODIUM-DEXTROSE 2-4 GM/100ML-% IV SOLN
INTRAVENOUS | Status: AC
  Filled 2020-12-16: qty 100

## 2020-12-16 MED ORDER — SODIUM CHLORIDE 0.9 % IV SOLN: INTRAVENOUS | Status: DC

## 2020-12-16 MED ORDER — METHOCARBAMOL 500 MG PO TABS: 500.0000 mg | ORAL_TABLET | Freq: Four times a day (QID) | ORAL | Status: DC | PRN

## 2020-12-16 MED ORDER — LIDOCAINE HCL (PF) 2 % IJ SOLN
INTRAMUSCULAR | Status: AC
  Filled 2020-12-16: qty 5

## 2020-12-16 MED ORDER — ONDANSETRON HCL 4 MG PO TABS: 4.0000 mg | ORAL_TABLET | Freq: Four times a day (QID) | ORAL | Status: DC | PRN

## 2020-12-16 MED ORDER — PANTOPRAZOLE SODIUM 40 MG PO TBEC
40.0000 mg | DELAYED_RELEASE_TABLET | Freq: Every day | ORAL | Status: DC
  Administered 2020-12-16 – 2020-12-19 (×4): 40 mg via ORAL
  Filled 2020-12-16 (×4): qty 1

## 2020-12-16 MED ORDER — DEXAMETHASONE SODIUM PHOSPHATE 10 MG/ML IJ SOLN
INTRAMUSCULAR | Status: DC | PRN
  Administered 2020-12-16: 10 mg via INTRAVENOUS

## 2020-12-16 MED ORDER — FENTANYL CITRATE (PF) 100 MCG/2ML IJ SOLN
INTRAMUSCULAR | Status: AC
  Filled 2020-12-16: qty 2

## 2020-12-16 MED ORDER — MENTHOL 3 MG MT LOZG
1.0000 | LOZENGE | OROMUCOSAL | Status: DC | PRN
  Filled 2020-12-16: qty 9

## 2020-12-16 MED ORDER — BISACODYL 10 MG RE SUPP
10.0000 mg | Freq: Every day | RECTAL | Status: DC | PRN
  Administered 2020-12-19: 10 mg via RECTAL
  Filled 2020-12-16: qty 1

## 2020-12-16 MED ORDER — ONDANSETRON HCL 4 MG/2ML IJ SOLN
INTRAMUSCULAR | Status: DC | PRN
  Administered 2020-12-16: 4 mg via INTRAVENOUS

## 2020-12-16 MED ORDER — DOCUSATE SODIUM 100 MG PO CAPS
100.0000 mg | ORAL_CAPSULE | Freq: Two times a day (BID) | ORAL | Status: DC
  Administered 2020-12-16 – 2020-12-19 (×6): 100 mg via ORAL
  Filled 2020-12-16 (×6): qty 1

## 2020-12-16 MED ORDER — DIPHENHYDRAMINE HCL 12.5 MG/5ML PO ELIX: 12.5000 mg | ORAL_SOLUTION | ORAL | Status: DC | PRN

## 2020-12-16 MED ORDER — BUPIVACAINE-EPINEPHRINE (PF) 0.25% -1:200000 IJ SOLN
INTRAMUSCULAR | Status: AC
  Filled 2020-12-16: qty 30

## 2020-12-16 MED ORDER — ACETAMINOPHEN 10 MG/ML IV SOLN
INTRAVENOUS | Status: AC
  Filled 2020-12-16: qty 100

## 2020-12-16 MED ORDER — OXYCODONE HCL 5 MG PO TABS: 5.0000 mg | ORAL_TABLET | Freq: Once | ORAL | Status: DC | PRN

## 2020-12-16 MED ORDER — POLYETHYLENE GLYCOL 3350 17 G PO PACK
17.0000 g | PACK | Freq: Every day | ORAL | Status: DC | PRN
  Administered 2020-12-18: 17 g via ORAL
  Filled 2020-12-16: qty 1

## 2020-12-16 MED ORDER — PHENYLEPHRINE HCL (PRESSORS) 10 MG/ML IV SOLN
INTRAVENOUS | Status: DC | PRN
  Administered 2020-12-16: 100 ug via INTRAVENOUS

## 2020-12-16 MED ORDER — SODIUM CHLORIDE FLUSH 0.9 % IV SOLN
INTRAVENOUS | Status: AC
  Filled 2020-12-16: qty 40

## 2020-12-16 MED ORDER — BUPIVACAINE LIPOSOME 1.3 % IJ SUSP
INTRAMUSCULAR | Status: AC
  Filled 2020-12-16: qty 20

## 2020-12-16 MED ORDER — DEXAMETHASONE SODIUM PHOSPHATE 10 MG/ML IJ SOLN
INTRAMUSCULAR | Status: AC
  Filled 2020-12-16: qty 1

## 2020-12-16 MED ORDER — HYDROCODONE-ACETAMINOPHEN 5-325 MG PO TABS
1.0000 | ORAL_TABLET | ORAL | Status: DC | PRN
  Administered 2020-12-18: 1 via ORAL
  Filled 2020-12-16: qty 1

## 2020-12-16 MED ORDER — PRONTOSAN WOUND IRRIGATION OPTIME
TOPICAL | Status: DC | PRN
  Administered 2020-12-16: 1

## 2020-12-16 MED ORDER — METOCLOPRAMIDE HCL 10 MG PO TABS: 5.0000 mg | ORAL_TABLET | Freq: Three times a day (TID) | ORAL | Status: DC | PRN

## 2020-12-16 MED ORDER — METOCLOPRAMIDE HCL 5 MG/ML IJ SOLN: 5.0000 mg | Freq: Three times a day (TID) | INTRAMUSCULAR | Status: DC | PRN

## 2020-12-16 MED ORDER — HYDROCODONE-ACETAMINOPHEN 7.5-325 MG PO TABS
1.0000 | ORAL_TABLET | ORAL | Status: DC | PRN
  Administered 2020-12-17 – 2020-12-19 (×6): 2 via ORAL
  Administered 2020-12-19: 1 via ORAL
  Filled 2020-12-16 (×6): qty 2
  Filled 2020-12-16: qty 1

## 2020-12-16 MED ORDER — CHLORHEXIDINE GLUCONATE 0.12 % MT SOLN
OROMUCOSAL | Status: AC
  Administered 2020-12-16: 15 mL via OROMUCOSAL
  Filled 2020-12-16: qty 15

## 2020-12-16 MED ORDER — CHLORHEXIDINE GLUCONATE CLOTH 2 % EX PADS
6.0000 | MEDICATED_PAD | Freq: Every day | CUTANEOUS | Status: DC
  Administered 2020-12-17 – 2020-12-18 (×2): 6 via TOPICAL

## 2020-12-16 MED ORDER — OXYCODONE HCL 5 MG/5ML PO SOLN: 5.0000 mg | Freq: Once | ORAL | Status: DC | PRN

## 2020-12-16 MED ORDER — SODIUM CHLORIDE 0.9 % IR SOLN
Status: DC | PRN
  Administered 2020-12-16: 3012 mL

## 2020-12-16 MED ORDER — PHENOL 1.4 % MT LIQD
1.0000 | OROMUCOSAL | Status: DC | PRN
  Filled 2020-12-16 (×2): qty 177

## 2020-12-16 MED ORDER — LIDOCAINE HCL (CARDIAC) PF 100 MG/5ML IV SOSY
PREFILLED_SYRINGE | INTRAVENOUS | Status: DC | PRN
  Administered 2020-12-16: 60 mg via INTRAVENOUS

## 2020-12-16 MED ORDER — ACETAMINOPHEN 10 MG/ML IV SOLN
INTRAVENOUS | Status: DC | PRN
  Administered 2020-12-16: 1000 mg via INTRAVENOUS

## 2020-12-16 MED ORDER — PHENYLEPHRINE HCL-NACL 20-0.9 MG/250ML-% IV SOLN
INTRAVENOUS | Status: DC | PRN
  Administered 2020-12-16: 50 ug/min via INTRAVENOUS

## 2020-12-16 MED ORDER — BUPIVACAINE HCL (PF) 0.5 % IJ SOLN
INTRAMUSCULAR | Status: AC
  Filled 2020-12-16: qty 10

## 2020-12-16 MED ORDER — MORPHINE SULFATE (PF) 10 MG/ML IV SOLN
INTRAVENOUS | Status: AC
  Filled 2020-12-16: qty 1

## 2020-12-16 MED ORDER — ENOXAPARIN SODIUM 30 MG/0.3ML IJ SOSY
30.0000 mg | PREFILLED_SYRINGE | Freq: Two times a day (BID) | INTRAMUSCULAR | Status: DC
  Administered 2020-12-17 – 2020-12-19 (×5): 30 mg via SUBCUTANEOUS
  Filled 2020-12-16 (×5): qty 0.3

## 2020-12-16 MED ORDER — ONDANSETRON HCL 4 MG/2ML IJ SOLN: 4.0000 mg | Freq: Four times a day (QID) | INTRAMUSCULAR | Status: DC | PRN

## 2020-12-16 MED ORDER — ALUM & MAG HYDROXIDE-SIMETH 200-200-20 MG/5ML PO SUSP
30.0000 mL | ORAL | Status: DC | PRN
  Administered 2020-12-17: 30 mL via ORAL
  Filled 2020-12-16: qty 30

## 2020-12-16 MED ORDER — BUPIVACAINE HCL (PF) 0.5 % IJ SOLN
INTRAMUSCULAR | Status: DC | PRN
  Administered 2020-12-16: 2.5 mL

## 2020-12-16 MED ORDER — PROPOFOL 10 MG/ML IV BOLUS
INTRAVENOUS | Status: DC | PRN
  Administered 2020-12-16: 10 mg via INTRAVENOUS
  Administered 2020-12-16: 30 mg via INTRAVENOUS

## 2020-12-16 MED ORDER — ONDANSETRON HCL 4 MG/2ML IJ SOLN: 4.0000 mg | Freq: Once | INTRAMUSCULAR | Status: DC | PRN

## 2020-12-16 MED ORDER — ONDANSETRON HCL 4 MG/2ML IJ SOLN
INTRAMUSCULAR | Status: AC
  Filled 2020-12-16: qty 2

## 2020-12-16 MED ORDER — FENTANYL CITRATE (PF) 100 MCG/2ML IJ SOLN
INTRAMUSCULAR | Status: DC | PRN
  Administered 2020-12-16 (×2): 50 ug via INTRAVENOUS

## 2020-12-16 MED ORDER — PRAVASTATIN SODIUM 20 MG PO TABS
20.0000 mg | ORAL_TABLET | Freq: Every day | ORAL | Status: DC
  Administered 2020-12-16 – 2020-12-18 (×3): 20 mg via ORAL
  Filled 2020-12-16 (×3): qty 1

## 2020-12-16 MED ORDER — ATENOLOL 25 MG PO TABS
12.5000 mg | ORAL_TABLET | Freq: Every day | ORAL | Status: DC
  Administered 2020-12-17 – 2020-12-19 (×3): 12.5 mg via ORAL
  Filled 2020-12-16 (×3): qty 1

## 2020-12-16 MED ORDER — SODIUM CHLORIDE (PF) 0.9 % IJ SOLN
INTRAMUSCULAR | Status: DC | PRN
  Administered 2020-12-16: 91 mL

## 2020-12-16 MED ORDER — ACETAMINOPHEN 325 MG PO TABS: 325.0000 mg | ORAL_TABLET | Freq: Four times a day (QID) | ORAL | Status: DC | PRN

## 2020-12-16 MED ORDER — NEOMYCIN-POLYMYXIN B GU 40-200000 IR SOLN
Status: AC
  Filled 2020-12-16: qty 20

## 2020-12-16 MED ORDER — ZOLPIDEM TARTRATE 5 MG PO TABS: 5.0000 mg | ORAL_TABLET | Freq: Every evening | ORAL | Status: DC | PRN

## 2020-12-16 MED ORDER — FLEET ENEMA 7-19 GM/118ML RE ENEM: 1.0000 | ENEMA | Freq: Once | RECTAL | Status: DC | PRN

## 2020-12-16 MED ORDER — PROPOFOL 500 MG/50ML IV EMUL
INTRAVENOUS | Status: DC | PRN
  Administered 2020-12-16: 75 ug/kg/min via INTRAVENOUS

## 2020-12-16 SURGICAL SUPPLY — 70 items
APL PRP STRL LF DISP 70% ISPRP (MISCELLANEOUS) ×2
BLADE SAGITTAL 25.0X1.19X90 (BLADE) ×2 IMPLANT
BLADE SAW 90X13X1.19 OSCILLAT (BLADE) ×2 IMPLANT
BLOCK CUTTING FEMUR 3 RT MED (MISCELLANEOUS) ×2 IMPLANT
BLOCK CUTTING TIBIAL 2 RT (MISCELLANEOUS) ×2 IMPLANT
BLOCK CUTTING TIBIAL 4 RT MIS (MISCELLANEOUS) ×2 IMPLANT
BNDG ELASTIC 6X5.8 VLCR STR LF (GAUZE/BANDAGES/DRESSINGS) ×2 IMPLANT
CANISTER WOUND CARE 500ML ATS (WOUND CARE) ×2 IMPLANT
CEMENT FEMORAL COMP SZ3 RT (Femur) ×2 IMPLANT
CEMENT HV SMART SET (Cement) ×4 IMPLANT
CHLORAPREP W/TINT 26 (MISCELLANEOUS) ×4 IMPLANT
COOLER POLAR GLACIER W/PUMP (MISCELLANEOUS) ×2 IMPLANT
DRAPE 3/4 80X56 (DRAPES) ×4 IMPLANT
DRSG MEPILEX SACRM 8.7X9.8 (GAUZE/BANDAGES/DRESSINGS) ×2 IMPLANT
ELECT CAUTERY BLADE 6.4 (BLADE) ×2 IMPLANT
ELECT REM PT RETURN 9FT ADLT (ELECTROSURGICAL) ×2
ELECTRODE REM PT RTRN 9FT ADLT (ELECTROSURGICAL) ×1 IMPLANT
FEMUR BONE MODEL (MISCELLANEOUS) ×2 IMPLANT
GAUZE 4X4 16PLY ~~LOC~~+RFID DBL (SPONGE) ×2 IMPLANT
GAUZE SPONGE 4X4 12PLY STRL (GAUZE/BANDAGES/DRESSINGS) IMPLANT
GAUZE XEROFORM 1X8 LF (GAUZE/BANDAGES/DRESSINGS) IMPLANT
GLOVE SURG ORTHO LTX SZ8 (GLOVE) ×2 IMPLANT
GLOVE SURG SYN 9.0  PF PI (GLOVE) ×1
GLOVE SURG SYN 9.0 PF PI (GLOVE) ×1 IMPLANT
GLOVE SURG UNDER LTX SZ8 (GLOVE) ×2 IMPLANT
GLOVE SURG UNDER POLY LF SZ9 (GLOVE) ×2 IMPLANT
GOWN SRG 2XL LVL 4 RGLN SLV (GOWNS) ×1 IMPLANT
GOWN STRL NON-REIN 2XL LVL4 (GOWNS) ×2
GOWN STRL REUS W/ TWL LRG LVL3 (GOWN DISPOSABLE) ×1 IMPLANT
GOWN STRL REUS W/ TWL XL LVL3 (GOWN DISPOSABLE) ×1 IMPLANT
GOWN STRL REUS W/TWL LRG LVL3 (GOWN DISPOSABLE) ×2
GOWN STRL REUS W/TWL XL LVL3 (GOWN DISPOSABLE) ×2
HOLDER FOLEY CATH W/STRAP (MISCELLANEOUS) ×2 IMPLANT
HOLSTER ELECTROSUGICAL PENCIL (MISCELLANEOUS) ×2 IMPLANT
INSERT TIBIAL SZ2 RT (Insert) ×2 IMPLANT
IV NS IRRIG 3000ML ARTHROMATIC (IV SOLUTION) ×2 IMPLANT
KIT PREVENA INCISION MGT20CM45 (CANNISTER) ×2 IMPLANT
KIT TURNOVER KIT A (KITS) ×2 IMPLANT
MANIFOLD NEPTUNE II (INSTRUMENTS) ×4 IMPLANT
NDL SAFETY ECLIPSE 18X1.5 (NEEDLE) ×1 IMPLANT
NEEDLE HYPO 18GX1.5 SHARP (NEEDLE) ×2
NEEDLE SPNL 18GX3.5 QUINCKE PK (NEEDLE) ×2 IMPLANT
NEEDLE SPNL 20GX3.5 QUINCKE YW (NEEDLE) ×2 IMPLANT
NS IRRIG 1000ML POUR BTL (IV SOLUTION) ×2 IMPLANT
PACK TOTAL KNEE (MISCELLANEOUS) ×2 IMPLANT
PAD WRAPON POLAR KNEE (MISCELLANEOUS) ×1 IMPLANT
PATELLA RESURFACING MEDACTA 02 (Bone Implant) ×2 IMPLANT
PENCIL SMOKE EVACUATOR COATED (MISCELLANEOUS) ×2 IMPLANT
PULSAVAC PLUS IRRIG FAN TIP (DISPOSABLE) ×2
SCALPEL PROTECTED #10 DISP (BLADE) ×4 IMPLANT
SOLUTION PRONTOSAN WOUND 350ML (IRRIGATION / IRRIGATOR) IMPLANT
SPONGE T-LAP 18X18 ~~LOC~~+RFID (SPONGE) ×6 IMPLANT
STAPLER SKIN PROX 35W (STAPLE) ×2 IMPLANT
STEM EXTENSION 11MMX30MM (Stem) ×2 IMPLANT
SUCTION FRAZIER HANDLE 10FR (MISCELLANEOUS) ×1
SUCTION TUBE FRAZIER 10FR DISP (MISCELLANEOUS) ×1 IMPLANT
SUT DVC 2 QUILL PDO  T11 36X36 (SUTURE) ×1
SUT DVC 2 QUILL PDO T11 36X36 (SUTURE) ×1 IMPLANT
SUT ETHIBOND 2 V 37 (SUTURE) IMPLANT
SUT V-LOC 90 ABS DVC 3-0 CL (SUTURE) ×2 IMPLANT
SYR 20ML LL LF (SYRINGE) ×2 IMPLANT
SYR 50ML LL SCALE MARK (SYRINGE) ×4 IMPLANT
TIB TRAY SZ 2 R FIXED (Joint) ×2 IMPLANT
TIP FAN IRRIG PULSAVAC PLUS (DISPOSABLE) ×1 IMPLANT
TOWEL OR 17X26 4PK STRL BLUE (TOWEL DISPOSABLE) ×2 IMPLANT
TOWER CARTRIDGE SMART MIX (DISPOSABLE) ×2 IMPLANT
TRAY FOLEY MTR SLVR 16FR STAT (SET/KITS/TRAYS/PACK) ×2 IMPLANT
WATER STERILE IRR 1000ML POUR (IV SOLUTION) ×2 IMPLANT
WATER STERILE IRR 500ML POUR (IV SOLUTION) IMPLANT
WRAPON POLAR PAD KNEE (MISCELLANEOUS) ×2

## 2020-12-16 NOTE — H&P (Signed)
Chief Complaint  Patient presents with   Pre-op Exam  Right TKA scheduled 12/16/20    History of the Present Illness: Karen Harvey is a 80 y.o. female here today for history and physical for right total knee arthroplasty with Dr. Hessie Knows on 12/16/2020. Patient has had years of progressive right hip pain that initially responded well to cortisone injections but recently has not received any relief. Pain is interfering with her quality of life and activities daily living. She is very active, performs a lot of yard work. Unable to mow her grass with a push mower. Pain is located along the medial joint line. Knee will swell and give way. She denies any back pain numbness tingling radicular symptoms. Pain is currently moderate to severe with all weightbearing activity.  The patient is a nonsmoker. No history of blood clots.  I have reviewed past medical, surgical, social and family history, and allergies as documented in the EMR.  Past Medical History: Past Medical History:  Diagnosis Date   Allergy   GERD (gastroesophageal reflux disease)   History of cataract   Hyperlipidemia   Hypertension   Past Surgical History: Past Surgical History:  Procedure Laterality Date   COLONOSCOPY 11/06/2012, 07/14/1997  Adenomatous Polyp: CBF 10/2017: Recall ltr mailed 09/25/17   COLONOSCOPY 03/03/2018  Adenomatous Polyps: CBF 02/2023   Past Family History: Family History  Problem Relation Age of Onset   High blood pressure (Hypertension) Mother   No Known Problems Father   Medications: Current Outpatient Medications Ordered in Epic  Medication Sig Dispense Refill   ALLERGY RELIEF, CETIRIZINE, 10 mg tablet TAKE 1 TABLET BY MOUTH EVERY DAY 30 tablet 2   aspirin 81 MG chewable tablet Take 81 mg by mouth once daily.   atenoloL (TENORMIN) 25 MG tablet TAKE 1/2 TABLET ONCE A DAY 45 tablet 1   BLADDER CONTROL PADS Pads 1 each once daily   donepeziL (ARICEPT) 10 MG tablet Take 1 tablet (10 mg total)  by mouth at bedtime for 180 days 90 tablet 1   mirtazapine (REMERON) 7.5 MG tablet TAKE 1 TABLET BY MOUTH NIGHTLY. 90 tablet 1   pantoprazole (PROTONIX) 40 MG DR tablet TAKE ONE TABLET TWICE DAILY BEFORE MEALS 180 tablet 1   pravastatin (PRAVACHOL) 20 MG tablet TAKE 1 TABLET BY MOUTH DAILY 90 tablet 1   triamcinolone 0.1 % cream triamcinolone acetonide 0.1 % topical cream APPLY TO AFFECTED AREA TWICE A DAY   VITAMIN D3 ORAL Take 1 capsule by mouth once daily   No current Epic-ordered facility-administered medications on file.   Allergies: No Known Allergies   Body mass index is 24.9 kg/m.  Review of Systems: A comprehensive 14 point ROS was performed, reviewed, and the pertinent orthopaedic findings are documented in the HPI.  Vitals:  12/12/20 1442  BP: 138/62    General Physical Examination:   General:  Well developed, well nourished, no apparent distress, normal affect, slow antalgic gait with no assist device  HEENT: Head normocephalic, atraumatic, PERRL.   Abdomen: Soft, non tender, non distended, Bowel sounds present.  Heart: Examination of the heart reveals regular, rate, and rhythm. There is no murmur noted on ascultation. There is a normal apical pulse.  Lungs: Lungs are clear to auscultation. There is no wheeze, rhonchi, or crackles. There is normal expansion of bilateral chest walls.   Right knee: On exam, the patient has right hip internal rotation to 0 degrees, external to 70 degrees. Bilateral knee varus alignment. She has  right knee range of motion of 10 to 115 degrees. No swelling or effusion. Negative Homans' sign. No peripheral edema.  Radiographs:  AP, lateral, standing, and sunrise x-rays of the bilateral knees were reviewed by me today from 07/22/2019. These show complete loss of medial joint space in each knee. Chondrocalcinosis is present. There is marked varus deformity. Lateral view shows large posterior osteophytes on the femur and tibia with  significant wear in the medial compartment, as well as moderate patellofemoral arthritis on the right. Left has smaller posterior osteophytes, but still significant, again with significant bone erosion on the femur and tibia. Sunrise view shows moderate patellofemoral arthritis.  X-ray Impression Bilateral severe osteoarthritis.  Assessment: ICD-10-CM  1. Primary osteoarthritis of right knee M17.11   Plan:  78. 80 year old female with advanced right knee osteoarthritis. Pain interferes with quality of life and activities daily living. She is had no relief with conservative treatment. Risks, benefits, complications of a right total knee arthroplasty have been discussed with the patient. Patient has agreed and consented procedure with Dr. Hessie Knows on 12/16/2020.  Electronically signed by Feliberto Gottron, PA at 12/12/2020 2:51 PM EST Reviewed  H+P. No changes noted.

## 2020-12-16 NOTE — Anesthesia Preprocedure Evaluation (Addendum)
Anesthesia Evaluation  Patient identified by MRN, date of birth, ID band Patient awake    Reviewed: Allergy & Precautions, H&P , NPO status , Patient's Chart, lab work & pertinent test results  History of Anesthesia Complications Negative for: history of anesthetic complications  Airway Mallampati: II  TM Distance: >3 FB Neck ROM: full    Dental  (+) Teeth Intact   Pulmonary neg pulmonary ROS, neg sleep apnea, neg COPD,    breath sounds clear to auscultation       Cardiovascular hypertension, (-) angina(-) Past MI and (-) Cardiac Stents negative cardio ROS  (-) dysrhythmias  Rhythm:regular Rate:Normal     Neuro/Psych Anxiety negative neurological ROS     GI/Hepatic Neg liver ROS, GERD  ,  Endo/Other  negative endocrine ROS  Renal/GU      Musculoskeletal  (+) Arthritis ,   Abdominal   Peds  Hematology negative hematology ROS (+)   Anesthesia Other Findings Past Medical History: No date: Anxiety No date: Arthritis No date: GERD (gastroesophageal reflux disease) No date: Heart murmur No date: Hyperlipidemia No date: Hypertension  Past Surgical History: 03/10/2019: CATARACT EXTRACTION W/PHACO; Right     Comment:  Procedure: CATARACT EXTRACTION PHACO AND INTRAOCULAR               LENS PLACEMENT (IOC) RIGHT 9.87  01:01.6;  Surgeon:               Birder Robson, MD;  Location: [place];                Service: Ophthalmology;  Laterality: Right; 11/06/2012, 07/14/1997: COLONOSCOPY W/ POLYPECTOMY     Comment:  adenomatous polyp 03/03/2018: COLONOSCOPY WITH PROPOFOL; N/A     Comment:  Procedure: COLONOSCOPY WITH PROPOFOL;  Surgeon: Manya Silvas, MD;  Location: [facility] ENDOSCOPY;  Service:               Endoscopy;  Laterality: N/A;  BMI    Body Mass Index: 29.27 kg/m      Reproductive/Obstetrics negative OB ROS                            Anesthesia  Physical Anesthesia Plan  ASA: 2  Anesthesia Plan: Spinal   Post-op Pain Management:    Induction:   PONV Risk Score and Plan: Propofol infusion  Airway Management Planned: Simple Face Mask  Additional Equipment:   Intra-op Plan:   Post-operative Plan:   Informed Consent: I have reviewed the patients History and Physical, chart, labs and discussed the procedure including the risks, benefits and alternatives for the proposed anesthesia with the patient or authorized representative who has indicated his/her understanding and acceptance.     Dental Advisory Given  Plan Discussed with: Anesthesiologist, CRNA and Surgeon  Anesthesia Plan Comments:        Anesthesia Quick Evaluation

## 2020-12-16 NOTE — Transfer of Care (Signed)
Immediate Anesthesia Transfer of Care Note  Patient: Karen Harvey  Procedure(s) Performed: TOTAL KNEE ARTHROPLASTY (Right: Knee)  Patient Location: PACU  Anesthesia Type:Spinal  Level of Consciousness: drowsy and patient cooperative  Airway & Oxygen Therapy: Patient Spontanous Breathing and Patient connected to face mask oxygen  Post-op Assessment: Report given to RN, Post -op Vital signs reviewed and stable and Patient moving all extremities X 4  Post vital signs: Reviewed and stable  Last Vitals:  Vitals Value Taken Time  BP 137/46 12/16/20 1430  Temp    Pulse 75 12/16/20 1431  Resp 17 12/16/20 1431  SpO2 98 % 12/16/20 1431  Vitals shown include unvalidated device data.  Last Pain:  Vitals:   12/16/20 1116  TempSrc: Temporal  PainSc: 0-No pain         Complications: No notable events documented.

## 2020-12-16 NOTE — Plan of Care (Signed)

## 2020-12-16 NOTE — Anesthesia Procedure Notes (Addendum)
Spinal  Patient location during procedure: OR Start time: 12/16/2020 12:30 PM End time: 12/16/2020 12:40 PM Reason for block: surgical anesthesia Staffing Performed: resident/CRNA and other anesthesia staff  Resident/CRNA: Lerry Liner, CRNA Other anesthesia staff: Sherlie Ban, RN Preanesthetic Checklist Completed: patient identified, IV checked, site marked, risks and benefits discussed, surgical consent, monitors and equipment checked, pre-op evaluation and timeout performed Spinal Block Patient position: sitting Prep: DuraPrep Patient monitoring: heart rate, cardiac monitor, continuous pulse ox and blood pressure Approach: midline Location: L3-4 Injection technique: single-shot Needle Needle type: Sprotte  Needle gauge: 24 G Needle length: 9 cm Assessment Sensory level: T4 Events: CSF return Additional Notes Negative heme, negative paresthesia, no pain with injection, good free flow CSF pre/post injection.

## 2020-12-16 NOTE — Op Note (Signed)
12/16/2020  2:33 PM  PATIENT:  Karen Harvey   MRN: [id number]  PRE-OPERATIVE DIAGNOSIS:  Primary localized osteoarthritis of right knee   POST-OPERATIVE DIAGNOSIS:  Same   PROCEDURE:  Procedure(s): Right TOTAL KNEE ARTHROPLASTY   SURGEON: Laurene Footman, MD   ASSISTANTS: none   ANESTHESIA:   spinal   EBL:  100   BLOOD ADMINISTERED:none   DRAINS:  Incisional wound VAC     LOCAL MEDICATIONS USED:  MARCAINE    and OTHER morphine and Exparel   SPECIMEN:  No Specimen   DISPOSITION OF SPECIMEN:  N/A   COUNTS:  YES   TOURNIQUET:  57 minute at 300 mm Hg   IMPLANTS: Medacta  GMK sphere system with 3 femur, 2 tibia with short stem and 10 mm insert.  Size 2 patella, all components cemented.   DICTATION: Viviann Spare Dictation   patient was brought to the operating room and spinal anesthesia was obtained.  After prepping and draping the right leg in sterile fashion, and after patient identification and timeout procedures were completed, tourniquet was raised  and midline skin incision was made followed by medial parapatellar arthrotomy with advanced medial compartment osteoarthritis, advanced patellofemoral arthritis and mild lateral compartment arthritis, partial synovectomy was also carried out.   The ACL and PCL and fat pad were excised along with anterior horns of the meniscus. The proximal tibia cutting guide from  the [facility] system was applied and the proximal tibia cut carried out.  The distal femoral cut was carried out in a similar fashion     The 3 femoral cutting guide applied with anterior posterior and chamfer cuts made.  The posterior horns of the menisci were removed at this point.   Injection of the above medication was carried out after the femoral and tibial cuts were carried out.  The 2 baseplate trial was placed pinned into position and proximal tibial preparation carried out with drilling hand reaming and the keel punch followed by placement of the 3 femur and sizing the  tibial insert size 10 millimeter gave the best fit with stability and full extension.  The distal femoral drill holes were made in the notch cut for the trochlear groove was then carried out with trials were then removed the patella was cut using the patellar cutting guide and it sized to a size 2after drill holes have been made  The knee was irrigated with pulsatile lavage and the bony surfaces dried the tibial component was cemented into place first.  Excess cement was removed and the polyethylene insert placed with a torque screw placed with a torque screwdriver tightened.  The distal femoral component was placed and the knee was held in extension as the patellar button was clamped into place.  After the cement was set, excess cement was removed and the knee was again irrigated thoroughly thoroughly irrigated.  The tourniquet was let down and hemostasis checked with electrocautery. The arthrotomy was repaired with a heavy Quill suture,  followed by 3-0 V lock subcuticular closure, skin staples followed by incisional wound VAC and Polar Care.Marland Kitchen   PLAN OF CARE: Admit for overnight observation   PATIENT DISPOSITION:  PACU - hemodynamically stable.

## 2020-12-16 NOTE — Evaluation (Signed)
Physical Therapy Evaluation Patient Details Name: Karen Harvey MRN: [id number] DOB: [birth year] Today's Date: 12/16/2020  History of Present Illness  Pt is an 80 y.o. female s/p R TKA 12/9 secondary to OA.  PMH includes htn, anxiety, HLD.  Clinical Impression  Prior to hospital admission, pt was independent with ambulation; lives alone on main level of home with 3-4 STE with L railing (also has step down to bathroom without railing); has family support.  Currently pt is SBA semi-supine to sitting edge of bed; CGA with transfers; and CGA with ambulating a few feet bed to recliner with RW.  Pt reporting very minimal R knee pain during session.  Pt tending to be quick with movement in general requiring cueing to slow down with activities but did well once cued to take her time/slow down.  R knee AROM flexion to 80 degrees.  Minimal assist for R LE SLR.  Pt would benefit from skilled PT to address noted impairments and functional limitations (see below for any additional details).  Upon hospital discharge, pt would benefit from [place] and support from family.    Recommendations for follow up therapy are one component of a multi-disciplinary discharge planning process, led by the attending physician.  Recommendations may be updated based on patient status, additional functional criteria and insurance authorization.  Follow Up Recommendations Home health PT    Assistance Recommended at Discharge Set up Supervision/Assistance  Functional Status Assessment Patient has had a recent decline in their functional status and demonstrates the ability to make significant improvements in function in a reasonable and predictable amount of time.  Equipment Recommendations  Rolling walker (2 wheels);BSC/3in1 (youth sized)    Recommendations for Other Services OT consult     Precautions / Restrictions Precautions Precautions: Knee;Fall Precaution Booklet Issued: Yes (comment) Precaution Comments: wound  vac Restrictions Weight Bearing Restrictions: Yes RLE Weight Bearing: Weight bearing as tolerated      Mobility  Bed Mobility Overal bed mobility: Needs Assistance Bed Mobility: Supine to Sit     Supine to sit: Supervision;HOB elevated     General bed mobility comments: assist for lines; vc's for technique    Transfers Overall transfer level: Needs assistance Equipment used: Rolling walker (2 wheels) Transfers: Sit to/from Stand Sit to Stand: Min guard           General transfer comment: vc's for UE/LE placement and overall technique    Ambulation/Gait Ambulation/Gait assistance: Min guard Gait Distance (Feet): 3 Feet (bed to recliner) Assistive device: Rolling walker (2 wheels)         General Gait Details: decreased stance time R LE but minimally antalgic; vc's for walker use and overall gait technique  Stairs            Wheelchair Mobility    Modified Rankin (Stroke Patients Only)       Balance Overall balance assessment: Needs assistance Sitting-balance support: No upper extremity supported;Feet supported Sitting balance-Leahy Scale: Good Sitting balance - Comments: steady sitting reaching within BOS   Standing balance support: Single extremity supported Standing balance-Leahy Scale: Fair Standing balance comment: steady standing with at least single UE support                             Pertinent Vitals/Pain Pain Assessment: Faces Faces Pain Scale: Hurts a little bit Pain Location: R knee Pain Descriptors / Indicators: Sore Pain Intervention(s): Limited activity within patient's tolerance;Monitored during session;Premedicated before session;Repositioned;Other (comment) (  polar care in place) Vitals (HR and O2 on room air) stable and WFL throughout treatment session.    Home Living Family/patient expects to be discharged to:: Private residence Living Arrangements: Alone Available Help at Discharge: Family;Available  PRN/intermittently Type of Home: House Home Access: Stairs to enter Entrance Stairs-Rails: Left Entrance Stairs-Number of Steps: 3-4 Alternate Level Stairs-Number of Steps: 1 step down to bathroom (no railing but has wall for support) Home Layout: Two level;Able to live on main level with bedroom/bathroom Home Equipment: Rolling Walker (2 wheels);BSC/3in1;Cane - quad      Prior Function Prior Level of Function : Independent/Modified Independent             Mobility Comments: No recent falls       Hand Dominance        Extremity/Trunk Assessment   Upper Extremity Assessment Upper Extremity Assessment: Overall WFL for tasks assessed    Lower Extremity Assessment Lower Extremity Assessment: RLE deficits/detail (L LE WFL) RLE Deficits / Details: minimal assist for R LE SLR; at least 3/5 hip flexion and ankle DF/PF RLE: Unable to fully assess due to pain    Cervical / Trunk Assessment Cervical / Trunk Assessment: Normal  Communication   Communication: No difficulties  Cognition Arousal/Alertness: Awake/alert Behavior During Therapy:  (pt tending to be quick to move requiring cueing to slow down) Overall Cognitive Status: Within Functional Limits for tasks assessed                                          General Comments General comments (skin integrity, edema, etc.): L knee dressing, wound vac, and polar care in place.  Nursing cleared pt for participation in physical therapy.  Pt agreeable to PT session.  Pt's sister and cousin present during session.    Exercises Total Joint Exercises Ankle Circles/Pumps: AROM;Strengthening;Both;10 reps;Supine Quad Sets: AROM;Strengthening;Both;10 reps;Supine Heel Slides: AAROM;Strengthening;Right;10 reps;Supine Hip ABduction/ADduction: AAROM;Strengthening;Right;10 reps;Supine Straight Leg Raises: AAROM;Strengthening;Right;10 reps;Supine Goniometric ROM: R knee AROM 5-80 degrees   Assessment/Plan    PT  Assessment Patient needs continued PT services  PT Problem List Decreased strength;Decreased range of motion;Decreased activity tolerance;Decreased balance;Decreased mobility;Decreased knowledge of use of DME;Decreased knowledge of precautions;Pain;Decreased skin integrity       PT Treatment Interventions DME instruction;Gait training;Stair training;Functional mobility training;Therapeutic activities;Therapeutic exercise;Balance training;Patient/family education    PT Goals (Current goals can be found in the Care Plan section)  Acute Rehab PT Goals Patient Stated Goal: to go home and improve mobility PT Goal Formulation: With patient Time For Goal Achievement: 12/30/20 Potential to Achieve Goals: Good    Frequency BID   Barriers to discharge        Co-evaluation               AM-PAC PT "6 Clicks" Mobility  Outcome Measure Help needed turning from your back to your side while in a flat bed without using bedrails?: None Help needed moving from lying on your back to sitting on the side of a flat bed without using bedrails?: A Little Help needed moving to and from a bed to a chair (including a wheelchair)?: A Little Help needed standing up from a chair using your arms (e.g., wheelchair or bedside chair)?: A Little Help needed to walk in hospital room?: A Little Help needed climbing 3-5 steps with a railing? : A Lot 6 Click Score: 18    End  of Session Equipment Utilized During Treatment: Gait belt Activity Tolerance: Patient tolerated treatment well Patient left: in chair;with call bell/phone within reach;with chair alarm set;with family/visitor present;Other (comment) (no SCD's in room; B heels floating via towel rolls; polar care in place) Nurse Communication: Mobility status;Precautions;Weight bearing status (pt's pain status) PT Visit Diagnosis: Other abnormalities of gait and mobility (R26.89);Muscle weakness (generalized) (M62.81);Difficulty in walking, not elsewhere  classified (R26.2);Pain Pain - Right/Left: Right Pain - part of body: Knee    Time: 1710-1747 PT Time Calculation (min) (ACUTE ONLY): 37 min   Charges:   PT Evaluation $PT Eval Low Complexity: 1 Low PT Treatments $Therapeutic Exercise: 8-22 mins $Therapeutic Activity: 8-22 mins       Leitha Bleak, PT 12/16/20, 6:11 PM

## 2020-12-17 LAB — CBC
HCT: 36.6 % (ref 36.0–46.0)
Hemoglobin: 12.7 g/dL (ref 12.0–15.0)
MCH: 30.3 pg (ref 26.0–34.0)
MCHC: 34.7 g/dL (ref 30.0–36.0)
MCV: 87.4 fL (ref 80.0–100.0)
Platelets: 265 10*3/uL (ref 150–400)
RBC: 4.19 MIL/uL (ref 3.87–5.11)
RDW: 12.3 % (ref 11.5–15.5)
WBC: 19.5 10*3/uL — ABNORMAL HIGH (ref 4.0–10.5)
nRBC: 0 % (ref 0.0–0.2)

## 2020-12-17 LAB — BASIC METABOLIC PANEL
Anion gap: 7 (ref 5–15)
BUN: 12 mg/dL (ref 8–23)
CO2: 23 mmol/L (ref 22–32)
Calcium: 10.9 mg/dL — ABNORMAL HIGH (ref 8.9–10.3)
Chloride: 107 mmol/L (ref 98–111)
Creatinine, Ser: 0.83 mg/dL (ref 0.44–1.00)
GFR, Estimated: >60 mL/min (ref >60)
Glucose, Bld: 151 mg/dL — ABNORMAL HIGH (ref 70–99)
Potassium: 4.3 mmol/L (ref 3.5–5.1)
Sodium: 137 mmol/L (ref 135–145)

## 2020-12-17 NOTE — Progress Notes (Signed)
Physical Therapy Treatment Patient Details Name: Karen Harvey MRN: [id number] DOB: 08-23-1940 Today's Date: 12/17/2020   History of Present Illness Pt is an 80 y.o. female s/p R TKA 12/9 secondary to OA.  PMH includes htn, anxiety, HLD.    PT Comments    Pt was sitting in recliner upon arriving. She is A and O x 3 and extremely pleasant. Does need some redirecting throughout session to stay focused on desired task. Overall session was greatly limited by pain. She was pre-medicated prior to session however pt continues to endorse 9/10 pain. She was willing to attempt ambulation but was unable to tolerate. She ambulated ~ 8 ft with CGA only. Author feels pt will progress quickly and will not require rehab once pain improves. If pain does not improve over next few sessions, will need to adjust recommendation to rehab.   Recommendations for follow up therapy are one component of a multi-disciplinary discharge planning process, led by the attending physician.  Recommendations may be updated based on patient status, additional functional criteria and insurance authorization.  Follow Up Recommendations  Home health PT     Assistance Recommended at Discharge Set up Supervision/Assistance  Equipment Recommendations  Rolling walker (2 wheels);BSC/3in1       Precautions / Restrictions Precautions Precautions: Knee;Fall Precaution Booklet Issued: Yes (comment) Precaution Comments: wound vac Restrictions Weight Bearing Restrictions: Yes RLE Weight Bearing: Weight bearing as tolerated     Mobility  Bed Mobility      General bed mobility comments: in recliner pre/post session    Transfers Overall transfer level: Needs assistance Equipment used: Rolling walker (2 wheels) Transfers: Sit to/from Stand Sit to Stand: Min guard    General transfer comment: Pt was able to stand from recliner to RW without difficulty however c/o sever pain in wt bearing.    Ambulation/Gait Ambulation/Gait  assistance: Min guard Gait Distance (Feet): 8 Feet Assistive device: Rolling walker (2 wheels) Gait Pattern/deviations: Antalgic;Trunk flexed;Narrow base of support;Decreased stride length;Decreased stance time - left;Decreased step length - left Gait velocity: decreased     General Gait Details: Pt was severely limited by pain. she does not require much assistance but is unable to go further distances due to the pain. she was premedicated ~ 30 minutes prior to session    Balance Overall balance assessment: Needs assistance Sitting-balance support: No upper extremity supported;Feet supported Sitting balance-Leahy Scale: Good Sitting balance - Comments: steady sitting reaching within BOS   Standing balance support: Bilateral upper extremity supported;During functional activity;Reliant on assistive device for balance Standing balance-Leahy Scale: Good Standing balance comment: steady standing with at least single UE support         Cognition Arousal/Alertness: Awake/alert Behavior During Therapy: WFL for tasks assessed/performed Overall Cognitive Status: Within Functional Limits for tasks assessed        General Comments: Pt is A and oriented x 3. Does get easily distracted and needs redirecting to focus on desired task. Pain limited throughout but pt does put forth great effort        Exercises Total Joint Exercises Ankle Circles/Pumps: AROM;Strengthening;Both;10 reps;Supine Quad Sets: AROM;Strengthening;Both;10 reps;Supine Gluteal Sets: AROM;10 reps Straight Leg Raises: AAROM;Strengthening;Right;10 reps;Supine Knee Flexion:  (unable to tolerate)        Pertinent Vitals/Pain Pain Assessment: 0-10 Pain Score: 9  Pain Location: R knee Pain Descriptors / Indicators: Aching;Sore Pain Intervention(s): Monitored during session;Limited activity within patient's tolerance;Premedicated before session;Repositioned    Home Living Family/patient expects to be discharged to::  Private residence  Living Arrangements: Alone Available Help at Discharge: Family;Available PRN/intermittently Type of Home: House Home Access: Stairs to enter Entrance Stairs-Rails: Left Entrance Stairs-Number of Steps: 3-4 Alternate Level Stairs-Number of Steps: 1 step down to bathroom (no railing but has wall for support) Home Layout: Two level;Able to live on main level with bedroom/bathroom Home Equipment: Rolling Walker (2 wheels);BSC/3in1;Cane - quad      Prior Function            PT Goals (current goals can now be found in the care plan section) Acute Rehab PT Goals Patient Stated Goal: less pain Progress towards PT goals: Progressing toward goals    Frequency    BID      PT Plan Current plan remains appropriate    AM-PAC PT "6 Clicks" Mobility   Outcome Measure  Help needed turning from your back to your side while in a flat bed without using bedrails?: None Help needed moving from lying on your back to sitting on the side of a flat bed without using bedrails?: A Little Help needed moving to and from a bed to a chair (including a wheelchair)?: A Little Help needed standing up from a chair using your arms (e.g., wheelchair or bedside chair)?: A Little Help needed to walk in hospital room?: A Little Help needed climbing 3-5 steps with a railing? : A Little 6 Click Score: 19    End of Session Equipment Utilized During Treatment: Gait belt Activity Tolerance: Patient tolerated treatment well Patient left: in chair;with call bell/phone within reach;with chair alarm set Nurse Communication: Mobility status;Precautions;Weight bearing status PT Visit Diagnosis: Other abnormalities of gait and mobility (R26.89);Muscle weakness (generalized) (M62.81);Difficulty in walking, not elsewhere classified (R26.2);Pain Pain - Right/Left: Right Pain - part of body: Knee     Time: 1212-1229 PT Time Calculation (min) (ACUTE ONLY): 17 min  Charges:  $Therapeutic Activity:  8-22 mins                     Julaine Fusi PTA 12/17/20, 1:18 PM

## 2020-12-17 NOTE — Evaluation (Signed)
Occupational Therapy Evaluation Patient Details Name: Karen Harvey MRN: [id number] DOB: 1940/02/17 Today's Date: 12/17/2020   History of Present Illness Pt is an 80 y.o. female s/p R TKA 12/9 secondary to OA.  PMH includes htn, anxiety, HLD.   Clinical Impression   Pt seen for OT evaluation this date, s/p right TKA.  Pt lives alone and would like to return home and has supportive family and friends who can help at discharge.   Pt was independent prior to admission with all basic self care, IADLs including driving and managing her yardwork.  Pt with increased pain this date 7/10 however did well with transfers, requiring min guard for safety.  Pt requires min assist to don right sock today otherwise she was min guard for LB dressing.  Pt presents with pain in right knee, decreased strength, decreased ability to perform self care tasks and functional transfers/mobility compared to her prior level of function.  She would benefit from skilled OT services to maximize her safety and independence for necessary daily tasks.  She will likely benefit from [facility] at discharge.       Recommendations for follow up therapy are one component of a multi-disciplinary discharge planning process, led by the attending physician.  Recommendations may be updated based on patient status, additional functional criteria and insurance authorization.   Follow Up Recommendations  Home health OT    Assistance Recommended at Discharge Intermittent Supervision/Assistance  Functional Status Assessment  Patient has had a recent decline in their functional status and demonstrates the ability to make significant improvements in function in a reasonable and predictable amount of time.  Equipment Recommendations       Recommendations for Other Services       Precautions / Restrictions Precautions Precautions: Knee;Fall Restrictions Weight Bearing Restrictions: Yes RLE Weight Bearing: Weight bearing as tolerated       Mobility Bed Mobility               General bed mobility comments: up to chair on arrival this date.    Transfers Overall transfer level: Needs assistance Equipment used: Rolling walker (2 wheels) Transfers: Sit to/from Stand Sit to Stand: Min guard           General transfer comment: vc's for UE/LE placement and overall technique      Balance Overall balance assessment: Needs assistance Sitting-balance support: No upper extremity supported;Feet supported Sitting balance-Leahy Scale: Good Sitting balance - Comments: steady sitting reaching within BOS   Standing balance support: Single extremity supported Standing balance-Leahy Scale: Fair Standing balance comment: steady standing with at least single UE support                           ADL either performed or assessed with clinical judgement   ADL Overall ADL's : Needs assistance/impaired Eating/Feeding: Modified independent   Grooming: Sitting;Wash/dry face;Oral care;Applying deodorant;Brushing hair;Set up   Upper Body Bathing: Set up Upper Body Bathing Details (indicate cue type and reason): pt plans to do sponge baths at home Lower Body Bathing: Set up;Minimal assistance Lower Body Bathing Details (indicate cue type and reason): difficulty reaching right foot Upper Body Dressing : Modified independent   Lower Body Dressing: Set up;Minimal assistance Lower Body Dressing Details (indicate cue type and reason): Pt able to doff right sock, requires min assist to get it started over toes and then able to manage.  Underwear with min guard and cues Toilet Transfer: Min guard  Toileting- Water quality scientist and Hygiene: Min guard       Functional mobility during ADLs: Min guard;Rolling walker (2 wheels) General ADL Comments: Pt reports her sister lives nearby and is able to help her as well as other Programmer, multimedia      Pertinent Vitals/Pain  Pain Score: 7  Pain Location: R knee Pain Descriptors / Indicators: Aching;Sore Pain Intervention(s): Limited activity within patient's tolerance;Monitored during session;Repositioned     Hand Dominance Right   Extremity/Trunk Assessment Upper Extremity Assessment Upper Extremity Assessment: Generalized weakness   Lower Extremity Assessment Lower Extremity Assessment: Defer to PT evaluation       Communication Communication Communication: No difficulties   Cognition Arousal/Alertness: Awake/alert Behavior During Therapy: WFL for tasks assessed/performed Overall Cognitive Status: Within Functional Limits for tasks assessed                                       General Comments    Instructed on LB dressing skills for socks, underwear, pants using hemi dressing techniques.    Exercises     Shoulder Instructions      Home Living Family/patient expects to be discharged to:: Private residence Living Arrangements: Alone Available Help at Discharge: Family;Available PRN/intermittently Type of Home: House Home Access: Stairs to enter [employer] of Steps: 3-4 Entrance Stairs-Rails: Left Home Layout: Two level;Able to live on main level with bedroom/bathroom Alternate Level Stairs-Number of Steps: 1 step down to bathroom (no railing but has wall for support)   Bathroom Shower/Tub: Teacher, early years/pre: Standard     Home Equipment: Conservation officer, nature (2 wheels);BSC/3in1;Cane - quad          Prior Functioning/Environment Prior Level of Function : Independent/Modified Independent             Mobility Comments: No recent falls ADLs Comments: Pt reports she was independent with self care, IADLs, including yardwork and driving prior to admission        OT Problem List: Decreased strength;Decreased knowledge of use of DME or AE;Decreased range of motion;Decreased activity tolerance;Impaired balance (sitting and/or standing);Pain       OT Treatment/Interventions: Self-care/ADL training;Therapeutic exercise;Patient/family education;Balance training;Therapeutic activities;DME and/or AE instruction    OT Goals(Current goals can be found in the care plan section) Acute Rehab OT Goals Patient Stated Goal: To go home and take care of herself as she did before OT Goal Formulation: With patient Time For Goal Achievement: 12/31/20 Potential to Achieve Goals: Good ADL Goals Pt Will Perform Lower Body Dressing: with modified independence Pt Will Transfer to Toilet: with modified independence  OT Frequency: Min 2X/week   Barriers to D/C:            Co-evaluation              AM-PAC OT "6 Clicks" Daily Activity     Outcome Measure Help from another person eating meals?: None Help from another person taking care of personal grooming?: None Help from another person toileting, which includes using toliet, bedpan, or urinal?: A Little Help from another person bathing (including washing, rinsing, drying)?: A Little Help from another person to put on and taking off regular upper body clothing?: None Help from another person to put on and taking off regular lower body clothing?: A Little 6 Click Score:  21   End of Session Equipment Utilized During Treatment: Gait belt;Rolling walker (2 wheels)  Activity Tolerance: Patient tolerated treatment well;Patient limited by pain Patient left: in chair;with call bell/phone within reach;with chair alarm set  OT Visit Diagnosis: Muscle weakness (generalized) (M62.81);Pain Pain - Right/Left: Right Pain - part of body: Knee                Time: 1120-1156 OT Time Calculation (min): 36 min Charges:  OT General Charges $OT Visit: 1 Visit OT Evaluation $OT Eval Low Complexity: 1 Low OT Treatments $Self Care/Home Management : 8-22 mins  Mitesh Rosendahl T Mykeria Garman, OTR/L, CLT   Rayshard Schirtzinger 12/17/2020, 12:07 PM

## 2020-12-17 NOTE — TOC Initial Note (Signed)
Transition of Care [facility]) - Initial/Assessment Note    Patient Details  Name: Karen Harvey MRN: [id number] Date of Birth: [birth year]  [facility] CM/SW Contact:    Boris Sharper, LCSW Phone Number: 12/17/2020, 5:18 PM  Clinical Narrative:                 CSW called to discuss PT recommendations with pt. Pt is in agreement with [facility] and OT and [facility] to follow for [facility]. Pt states that she lives home alone and transports herself to appointments.  Expected Discharge Plan: [place] Barriers to Discharge: Continued Medical Work up   Patient Goals and CMS Choice Patient states their goals for this hospitalization and ongoing recovery are:: to get out of pain and go home CMS Medicare.gov Compare Post Acute Care list provided to:: Patient Choice offered to / list presented to : Patient  Expected Discharge Plan and Services Expected Discharge Plan: [place] Choice: [place] arrangements for the past 2 months: Single Family Home                 DME Arranged: Patient refused services ([place] has a RW)         HH Arranged: PT, OT HH Agency: [place] Date [place]: 12/17/20 Time [place]: [place] Representative spoke with at [place]: [place] [facility]  Prior Living Arrangements/Services Living arrangements for the past 2 months: [place] with:: Self Patient language and need for interpreter reviewed:: Yes Do you feel safe going back to the place where you live?: Yes            Criminal Activity/Legal Involvement Pertinent to Current Situation/Hospitalization: No - Comment as needed  Activities of Daily Living Home Assistive Devices/Equipment: Environmental consultant (specify type), Cane (specify quad or straight) ADL Screening (condition at time of admission) Patient's cognitive ability adequate to safely complete daily activities?: Yes Is the patient deaf or  have difficulty hearing?: No Does the patient have difficulty seeing, even when wearing glasses/contacts?: No Does the patient have difficulty concentrating, remembering, or making decisions?: No Patient able to express need for assistance with ADLs?: Yes Does the patient have difficulty dressing or bathing?: No Independently performs ADLs?: Yes (appropriate for developmental age) Does the patient have difficulty walking or climbing stairs?: No Weakness of Legs: None Weakness of Arms/Hands: None  Permission Sought/Granted Permission sought to share information with : Facility Art therapist granted to share information with : Yes, Release of Information Signed  Share Information with NAME: Bessie     Permission granted to share info w Relationship: Sister  Permission granted to share info w Contact Information: [phone]  Emotional Assessment Appearance:: Other (Comment Required (unable to assess) Attitude/Demeanor/Rapport: Unable to Assess Affect (typically observed): Unable to Assess Orientation: : Oriented to Self, Oriented to Place, Oriented to  Time, Oriented to Situation Alcohol / Substance Use: Not Applicable Psych Involvement: No (comment)  Admission diagnosis:  S/P TKR (total knee replacement) using cement, 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

## 2020-12-17 NOTE — Progress Notes (Signed)
  Subjective: 1 Day Post-Op Procedure(s) (LRB): TOTAL KNEE ARTHROPLASTY (Right) Patient reports pain as severe.   Patient is well, and has had no acute complaints or problems other than her knee pain Plan is to go Home after hospital stay. Negative for chest pain and shortness of breath Fever: no Gastrointestinal: negative for nausea and vomiting.  Patient has not had a bowel movement.  Objective: Vital signs in last 24 hours: Temp:  [97.5 F (36.4 C)-97.7 F (36.5 C)] 97.5 F (36.4 C) (12/10 0741) Pulse Rate:  [64-88] 72 (12/10 0741) Resp:  [10-23] 16 (12/10 0741) BP: (111-167)/(46-88) 141/63 (12/10 0741) SpO2:  [94 %-100 %] 96 % (12/10 0741) Weight:  [72.6 kg] 72.6 kg (12/09 1116)  Intake/Output from previous day:  Intake/Output Summary (Last 24 hours) at 12/17/2020 1009 Last data filed at 12/17/2020 0600 Gross per 24 hour  Intake 2788.95 ml  Output 2150 ml  Net 638.95 ml    Intake/Output this shift: No intake/output data recorded.  Labs: Recent Labs    12/16/20 1621 12/17/20 0516  HGB 14.4 12.7   Recent Labs    12/16/20 1621 12/17/20 0516  WBC 9.5 19.5*  RBC 4.71 4.19  HCT 41.8 36.6  PLT 227 265   Recent Labs    12/16/20 1621 12/17/20 0516  NA  --  137  K  --  4.3  CL  --  107  CO2  --  23  BUN  --  12  CREATININE 0.74 0.83  GLUCOSE  --  151*  CALCIUM  --  10.9*   No results for input(s): LABPT, INR in the last 72 hours.   EXAM General - Patient is Alert, Appropriate, and Oriented Extremity - Neurovascular intact Dorsiflexion/Plantar flexion intact Compartment soft Dressing/Incision -Prevena in place, no drainage noted in cannister  Motor Function - intact, moving foot and toes well on exam.    Assessment/Plan: 1 Day Post-Op Procedure(s) (LRB): TOTAL KNEE ARTHROPLASTY (Right) Principal Problem:   S/P TKR (total knee replacement) using cement, right  Estimated body mass index is 29.27 kg/m as calculated from the following:   Height  as of this encounter: 5\' 2"  (1.575 m).   Weight as of this encounter: 72.6 kg. Advance diet Up with therapy  No ice in [facility], patient states it has been warm all night. Ice chest refilled. Stressed need to work with therapy for safe discharge.    DVT Prophylaxis - Lovenox, Ted hose, and foot pumps Weight-Bearing as tolerated to right leg  Cassell Smiles, PA-C [facility] Orthopaedic Surgery 12/17/2020, 10:09 AM

## 2020-12-17 NOTE — Plan of Care (Signed)

## 2020-12-17 NOTE — Progress Notes (Signed)
Physical Therapy Treatment Patient Details Name: Karen Harvey MRN: [id number] DOB: [birth year] Today's Date: 12/17/2020   History of Present Illness Pt is an 80 y.o. female s/p R TKA 12/9 secondary to OA.  PMH includes htn, anxiety, HLD.    PT Comments    Pt was sitting in recliner with RN tech taking vitals. She is Alert and Oriented x 3. Does have some impulsivity at times but overall able to follow commands consistently throughout. Pt continues to endorse severe pain that limits session progression however Pryor Curia was able to get pt to ambulate ~ 50 ft with RW. No physical assistance to stand and ambulate. After ambulation, pt performed HEP handout with little assist. AROM 4-84 degrees. PT continues to recommend DC home with HHPT to follow. Per pt, " I will have someone with me at all times. My sister and friends are going to stay with me." Pt was unwilling to ambulate into hallway however author discussed needing to advance gait distances and perform stairs prior to [place] home. Recommend HHPT at DC to continue to progress pt to PLOF while maximizing independence with ADLs.    Recommendations for follow up therapy are one component of a multi-disciplinary discharge planning process, led by the attending physician.  Recommendations may be updated based on patient status, additional functional criteria and insurance authorization.  Follow Up Recommendations  Home health PT     Assistance Recommended at Discharge Set up Supervision/Assistance  Equipment Recommendations  Rolling walker (2 wheels);BSC/3in1       Precautions / Restrictions Precautions Precautions: Knee;Fall Precaution Booklet Issued: Yes (comment) Precaution Comments: wound vac Restrictions Weight Bearing Restrictions: Yes RLE Weight Bearing: Weight bearing as tolerated     Mobility  Bed Mobility      General bed mobility comments: in recliner pre/post session    Transfers Overall transfer level: Needs  assistance Equipment used: Rolling walker (2 wheels) Transfers: Sit to/from Stand Sit to Stand: Supervision   General transfer comment: Pt was able to stand from recliner without physical assistance. she did required vcs for proper technqiue and sequencing    Ambulation/Gait Ambulation/Gait assistance: Min Gaffer (Feet): 50 Feet Assistive device: Rolling walker (2 wheels) Gait Pattern/deviations: Antalgic;Trunk flexed;Narrow base of support;Decreased stride length;Decreased stance time - left;Decreased step length - left Gait velocity: decreased     General Gait Details: Pt continued to be limited by pain however was able to ambulate to doorway of room > recliner 2 x. unwilling to advance to further distances due to pain.   Stairs Stairs:  (will be addressed in future sessions)          Balance Overall balance assessment: Needs assistance Sitting-balance support: No upper extremity supported;Feet supported Sitting balance-Leahy Scale: Good Sitting balance - Comments: steady sitting reaching within BOS   Standing balance support: Bilateral upper extremity supported;During functional activity;Reliant on assistive device for balance Standing balance-Leahy Scale: Good Standing balance comment: steady standing with at least single UE support         Cognition Arousal/Alertness: Awake/alert Behavior During Therapy: WFL for tasks assessed/performed Overall Cognitive Status: Within Functional Limits for tasks assessed        General Comments: Pt is A and O x 3. very energetic and at times slightly impulsive. Does require redirecting to focus on desired task.        Exercises Total Joint Exercises Ankle Circles/Pumps: AROM;Strengthening;Both;10 reps;Supine Quad Sets: AROM;10 reps Gluteal Sets: AROM;10 reps Heel Slides: AROM;10 reps Hip ABduction/ADduction: AAROM;10 reps  Straight Leg Raises: AAROM;10 reps Long Arc Quad: AROM;5 reps Knee Flexion:   (unable to tolerate) Goniometric ROM: 4-84        Pertinent Vitals/Pain Pain Assessment: 0-10 Pain Score: 8  Pain Location: R knee Pain Descriptors / Indicators: Aching;Sore Pain Intervention(s): Limited activity within patient's tolerance;Monitored during session;Premedicated before session;Repositioned;Ice applied    Home Living Family/patient expects to be discharged to:: Private residence Living Arrangements: Alone Available Help at Discharge: Family;Available PRN/intermittently Type of Home: House Home Access: Stairs to enter Entrance Stairs-Rails: Left Entrance Stairs-Number of Steps: 3-4 Alternate Level Stairs-Number of Steps: 1 step down to bathroom (no railing but has wall for support) Home Layout: Two level;Able to live on main level with bedroom/bathroom Home Equipment: Rolling Walker (2 wheels);BSC/3in1;Cane - quad      Prior Function            PT Goals (current goals can now be found in the care plan section) Acute Rehab PT Goals Patient Stated Goal: get better and go home Progress towards PT goals: Progressing toward goals    Frequency    BID      PT Plan Current plan remains appropriate       AM-PAC PT "6 Clicks" Mobility   Outcome Measure  Help needed turning from your back to your side while in a flat bed without using bedrails?: None Help needed moving from lying on your back to sitting on the side of a flat bed without using bedrails?: A Little Help needed moving to and from a bed to a chair (including a wheelchair)?: A Little Help needed standing up from a chair using your arms (e.g., wheelchair or bedside chair)?: A Little Help needed to walk in hospital room?: A Little Help needed climbing 3-5 steps with a railing? : A Little 6 Click Score: 19    End of Session Equipment Utilized During Treatment: Gait belt Activity Tolerance: Patient tolerated treatment well Patient left: in chair;with call bell/phone within reach;with chair alarm  set Nurse Communication: Mobility status;Precautions;Weight bearing status PT Visit Diagnosis: Other abnormalities of gait and mobility (R26.89);Muscle weakness (generalized) (M62.81);Difficulty in walking, not elsewhere classified (R26.2);Pain Pain - Right/Left: Right Pain - part of body: Knee     Time: 0321-2248 PT Time Calculation (min) (ACUTE ONLY): 34 min  Charges:  $Gait Training: 8-22 mins $Therapeutic Exercise: 8-22 mins $Therapeutic Activity: 8-22 mins          Julaine Fusi PTA 12/17/20, 3:52 PM

## 2020-12-18 LAB — CBC
HCT: 35 % — ABNORMAL LOW (ref 36.0–46.0)
Hemoglobin: 12 g/dL (ref 12.0–15.0)
MCH: 30.6 pg (ref 26.0–34.0)
MCHC: 34.3 g/dL (ref 30.0–36.0)
MCV: 89.3 fL (ref 80.0–100.0)
Platelets: 207 10*3/uL (ref 150–400)
RBC: 3.92 MIL/uL (ref 3.87–5.11)
RDW: 12.7 % (ref 11.5–15.5)
WBC: 12.9 10*3/uL — ABNORMAL HIGH (ref 4.0–10.5)
nRBC: 0 % (ref 0.0–0.2)

## 2020-12-18 NOTE — Plan of Care (Signed)

## 2020-12-18 NOTE — Anesthesia Postprocedure Evaluation (Signed)
Anesthesia Post Note  Patient: Karen Harvey  Procedure(s) Performed: TOTAL KNEE ARTHROPLASTY (Right: Knee)  Patient location during evaluation: Nursing Unit Anesthesia Type: Spinal Level of consciousness: awake and alert and oriented Pain management: pain level controlled Vital Signs Assessment: post-procedure vital signs reviewed and stable Respiratory status: spontaneous breathing and nonlabored ventilation Cardiovascular status: blood pressure returned to baseline and stable Postop Assessment: no headache, no backache, patient able to bend at knees, no apparent nausea or vomiting, adequate PO intake and able to ambulate Anesthetic complications: no Comments: Pt sitting in chair on exam.  No complaints.   No issues with anesthesia.   No notable events documented.   Last Vitals:  Vitals:   12/18/20 0435 12/18/20 0803  BP: 126/80 (!) 149/57  Pulse: 63 70  Resp: 16 18  Temp: 36.8 C 36.7 C  SpO2: 98% 96%    Last Pain:  Vitals:   12/18/20 0907  TempSrc:   PainSc: 3                  Harrie Foreman

## 2020-12-18 NOTE — Progress Notes (Signed)
  Subjective: 2 Days Post-Op Procedure(s) (LRB): TOTAL KNEE ARTHROPLASTY (Right) Patient reports pain as  improved but still very sore in the operative knee .   Patient is well, and has had no acute complaints or problems Plan is to go Home after hospital stay. Negative for chest pain and shortness of breath Fever: no Gastrointestinal: negative for nausea and vomiting.  Patient has not had a bowel movement.  Objective: Vital signs in last 24 hours: Temp:  [97.9 F (36.6 C)-98.5 F (36.9 C)] 98 F (36.7 C) (12/11 0803) Pulse Rate:  [63-71] 70 (12/11 0803) Resp:  [16-18] 18 (12/11 0803) BP: (126-149)/(54-80) 149/57 (12/11 0803) SpO2:  [96 %-98 %] 96 % (12/11 0803)  Intake/Output from previous day: No intake or output data in the 24 hours ending 12/18/20 0854  Intake/Output this shift: No intake/output data recorded.  Labs: Recent Labs    12/16/20 1621 12/17/20 0516  HGB 14.4 12.7   Recent Labs    12/16/20 1621 12/17/20 0516  WBC 9.5 19.5*  RBC 4.71 4.19  HCT 41.8 36.6  PLT 227 265   Recent Labs    12/16/20 1621 12/17/20 0516  NA  --  137  K  --  4.3  CL  --  107  CO2  --  23  BUN  --  12  CREATININE 0.74 0.83  GLUCOSE  --  151*  CALCIUM  --  10.9*   No results for input(s): LABPT, INR in the last 72 hours.   EXAM General - Patient is Alert, Appropriate, and Oriented Extremity - Neurovascular intact Dorsiflexion/Plantar flexion intact Compartment soft Dressing/Incision -Prevena in place, no drainage in cannister Motor Function - intact, moving foot and toes well on exam. Able to perform SLR with assistance.   Cardiovascular- Regular rate and rhythm, no murmurs/rubs/gallops Respiratory- Lungs clear to auscultation bilaterally Gastrointestinal- soft, nontender, and hypoactive bowel sounds   Assessment/Plan: 2 Days Post-Op Procedure(s) (LRB): TOTAL KNEE ARTHROPLASTY (Right) Principal Problem:   S/P TKR (total knee replacement) using cement,  right  Estimated body mass index is 29.27 kg/m as calculated from the following:   Height as of this encounter: 5\' 2"  (1.575 m).   Weight as of this encounter: 72.6 kg. Advance diet Up with therapy  Possible d/c today pending PT clearance.       DVT Prophylaxis - Lovenox, Ted hose, and SCDs Weight-Bearing as tolerated to right leg  Cassell Smiles, PA-C [facility] Orthopaedic Surgery 12/18/2020, 8:54 AM

## 2020-12-18 NOTE — Plan of Care (Signed)
  Problem: Education: Goal: Knowledge of the prescribed therapeutic regimen will improve Outcome: Progressing   Problem: Clinical Measurements: Goal: Postoperative complications will be avoided or minimized Outcome: Progressing   Problem: Skin Integrity: Goal: Will show signs of wound healing Outcome: Progressing

## 2020-12-18 NOTE — Progress Notes (Signed)
Physical Therapy Treatment Patient Details Name: Karen Harvey MRN: [id number] DOB: 01-24-1940 Today's Date: 12/18/2020   History of Present Illness Pt is an 80 y.o. female s/p R TKA 12/9 secondary to OA.  PMH includes htn, anxiety, HLD.    PT Comments    Pt received seated in recliner upon arrival to room.  Pt agreeable to therapy but verbalized self-doubt on being able to stand and walk.  Pt is able to ambulate greater distance today with 3-4 standing rest breaks in between ambulation to the therapy gym.  Pt took seated rest break in gym while therapist gave visual and verbal cuing on how to perform stair training.  Pt attempted to perform stair training but was unable to perform and had multiple incidents where pt experienced LOB and required modA from therapist to remain upright.  +2 arrive to assist for safety measures.  Pt very impulsive at stairs and grabbing onto railing and therapist's body/clothing for support, rather than the walker.  Pt also with decreased recall with how to perform exercises and had to be reminded consistently throughout training on which LE to utilize going up/down.  PT recommendation is currently home with HHPT at this time, however if unable to perform stair training and increased ambulation distance, discharge options will need to be update.  Pt is currently unstable to perform stair training at this time and would benefit from skilled therapy to address these concerns.    Will update recommendations in PM session if pt is unable to perform well.   Recommendations for follow up therapy are one component of a multi-disciplinary discharge planning process, led by the attending physician.  Recommendations may be updated based on patient status, additional functional criteria and insurance authorization.  Follow Up Recommendations  Home health PT     Assistance Recommended at Discharge Set up Supervision/Assistance  Equipment Recommendations  Rolling walker (2  wheels);BSC/3in1    Recommendations for Other Services       Precautions / Restrictions Precautions Precautions: Knee;Fall Precaution Booklet Issued: Yes (comment) Precaution Comments: wound vac Restrictions Weight Bearing Restrictions: Yes RLE Weight Bearing: Weight bearing as tolerated     Mobility  Bed Mobility               General bed mobility comments: in recliner pre/post session Patient Response: Anxious;Impulsive  Transfers Overall transfer level: Needs assistance Equipment used: Rolling walker (2 wheels) Transfers: Sit to/from Stand Sit to Stand: Supervision           General transfer comment: Pt was able to stand from recliner without physical assistance. she did required vcs for proper technqiue and sequencing    Ambulation/Gait Ambulation/Gait assistance: Min Gaffer (Feet): 80 Feet Assistive device: Rolling walker (2 wheels) Gait Pattern/deviations: Antalgic;Trunk flexed;Narrow base of support;Decreased stride length;Decreased stance time - left;Decreased step length - left       General Gait Details: Pt continued to be limited by pain however was able to ambulate to therapy gym and have a seated rest break prior to attempting stairs. unwilling to advance to further distances due to pain.   Stairs Stairs: Yes Stairs assistance: Mod assist;Max assist;+2 physical assistance Stair Management: No rails;Step to pattern;Backwards;Forwards;With walker Number of Stairs: 4 General stair comments: Pt not stable with stair training well.  Pt with posterior lean and multiple LOB that therapist had to assist with in order to prevent uncontrolled descent.  Pt also with impulsivity during training reaching for the rails and therapists body for  support instead of walker.   Wheelchair Mobility    Modified Rankin (Stroke Patients Only)       Balance Overall balance assessment: Needs assistance Sitting-balance support: No upper  extremity supported;Feet supported Sitting balance-Leahy Scale: Good     Standing balance support: Bilateral upper extremity supported;During functional activity;Reliant on assistive device for balance Standing balance-Leahy Scale: Good                              Cognition Arousal/Alertness: Awake/alert Behavior During Therapy: WFL for tasks assessed/performed Overall Cognitive Status: Within Functional Limits for tasks assessed                                 General Comments: Pt is A and O x 3. Pt is impulsive at times and does not listen well to commands, specifically with walker instructions.        Exercises      General Comments        Pertinent Vitals/Pain Pain Assessment: Faces Faces Pain Scale: Hurts whole lot Pain Location: R knee Pain Descriptors / Indicators: Aching;Sore Pain Intervention(s): Monitored during session;Premedicated before session;Repositioned;Ice applied    Home Living                          Prior Function            PT Goals (current goals can now be found in the care plan section) Acute Rehab PT Goals Patient Stated Goal: get better and go home PT Goal Formulation: With patient Time For Goal Achievement: 12/30/20 Potential to Achieve Goals: Fair Progress towards PT goals: Progressing toward goals    Frequency    BID      PT Plan Current plan remains appropriate    Co-evaluation              AM-PAC PT "6 Clicks" Mobility   Outcome Measure  Help needed turning from your back to your side while in a flat bed without using bedrails?: None Help needed moving from lying on your back to sitting on the side of a flat bed without using bedrails?: A Little Help needed moving to and from a bed to a chair (including a wheelchair)?: A Little Help needed standing up from a chair using your arms (e.g., wheelchair or bedside chair)?: A Little Help needed to walk in hospital room?: A  Little Help needed climbing 3-5 steps with a railing? : Total 6 Click Score: 17    End of Session Equipment Utilized During Treatment: Gait belt Activity Tolerance: Patient limited by pain Patient left: in chair;with call bell/phone within reach;with chair alarm set Nurse Communication: Mobility status;Precautions;Weight bearing status PT Visit Diagnosis: Other abnormalities of gait and mobility (R26.89);Muscle weakness (generalized) (M62.81);Difficulty in walking, not elsewhere classified (R26.2);Pain Pain - Right/Left: Right Pain - part of body: Knee     Time: 7858-8502 PT Time Calculation (min) (ACUTE ONLY): 29 min  Charges:  $Gait Training: 23-37 mins                     Gwenlyn Saran, PT, DPT 12/18/20, 12:15 PM    Christie Nottingham 12/18/2020, 12:10 PM

## 2020-12-19 ENCOUNTER — Encounter: Payer: Self-pay | Admitting: Orthopedic Surgery

## 2020-12-19 ENCOUNTER — Emergency Department: Payer: Medicare HMO

## 2020-12-19 ENCOUNTER — Other Ambulatory Visit: Payer: Self-pay

## 2020-12-19 DIAGNOSIS — I10 Essential (primary) hypertension: Secondary | ICD-10-CM | POA: Insufficient documentation

## 2020-12-19 DIAGNOSIS — M7989 Other specified soft tissue disorders: Secondary | ICD-10-CM | POA: Diagnosis not present

## 2020-12-19 DIAGNOSIS — M25561 Pain in right knee: Secondary | ICD-10-CM | POA: Insufficient documentation

## 2020-12-19 DIAGNOSIS — Z20822 Contact with and (suspected) exposure to covid-19: Secondary | ICD-10-CM | POA: Insufficient documentation

## 2020-12-19 DIAGNOSIS — Z96651 Presence of right artificial knee joint: Secondary | ICD-10-CM | POA: Insufficient documentation

## 2020-12-19 DIAGNOSIS — Z79899 Other long term (current) drug therapy: Secondary | ICD-10-CM | POA: Insufficient documentation

## 2020-12-19 DIAGNOSIS — W01198A Fall on same level from slipping, tripping and stumbling with subsequent striking against other object, initial encounter: Secondary | ICD-10-CM | POA: Insufficient documentation

## 2020-12-19 LAB — BASIC METABOLIC PANEL
Anion gap: 5 (ref 5–15)
BUN: 12 mg/dL (ref 8–23)
CO2: 27 mmol/L (ref 22–32)
Calcium: 11.2 mg/dL — ABNORMAL HIGH (ref 8.9–10.3)
Chloride: 100 mmol/L (ref 98–111)
Creatinine, Ser: 0.67 mg/dL (ref 0.44–1.00)
GFR, Estimated: >60 mL/min (ref >60)
Glucose, Bld: 134 mg/dL — ABNORMAL HIGH (ref 70–99)
Potassium: 3.9 mmol/L (ref 3.5–5.1)
Sodium: 132 mmol/L — ABNORMAL LOW (ref 135–145)

## 2020-12-19 LAB — CBC WITH DIFFERENTIAL/PLATELET
Abs Immature Granulocytes: 0.07 10*3/uL (ref 0.00–0.07)
Basophils Absolute: 0.1 10*3/uL (ref 0.0–0.1)
Basophils Relative: 1 %
Eosinophils Absolute: 0.1 10*3/uL (ref 0.0–0.5)
Eosinophils Relative: 0 %
HCT: 34.4 % — ABNORMAL LOW (ref 36.0–46.0)
Hemoglobin: 11.9 g/dL — ABNORMAL LOW (ref 12.0–15.0)
Immature Granulocytes: 1 %
Lymphocytes Relative: 13 %
Lymphs Abs: 1.6 10*3/uL (ref 0.7–4.0)
MCH: 31 pg (ref 26.0–34.0)
MCHC: 34.6 g/dL (ref 30.0–36.0)
MCV: 89.6 fL (ref 80.0–100.0)
Monocytes Absolute: 1.2 10*3/uL — ABNORMAL HIGH (ref 0.1–1.0)
Monocytes Relative: 9 %
Neutro Abs: 9.7 10*3/uL — ABNORMAL HIGH (ref 1.7–7.7)
Neutrophils Relative %: 76 %
Platelets: 249 10*3/uL (ref 150–400)
RBC: 3.84 MIL/uL — ABNORMAL LOW (ref 3.87–5.11)
RDW: 12.4 % (ref 11.5–15.5)
WBC: 12.7 10*3/uL — ABNORMAL HIGH (ref 4.0–10.5)
nRBC: 0 % (ref 0.0–0.2)

## 2020-12-19 LAB — CBC
HCT: 34.7 % — ABNORMAL LOW (ref 36.0–46.0)
Hemoglobin: 11.9 g/dL — ABNORMAL LOW (ref 12.0–15.0)
MCH: 30.6 pg (ref 26.0–34.0)
MCHC: 34.3 g/dL (ref 30.0–36.0)
MCV: 89.2 fL (ref 80.0–100.0)
Platelets: 243 10*3/uL (ref 150–400)
RBC: 3.89 MIL/uL (ref 3.87–5.11)
RDW: 12.4 % (ref 11.5–15.5)
WBC: 12.8 10*3/uL — ABNORMAL HIGH (ref 4.0–10.5)
nRBC: 0 % (ref 0.0–0.2)

## 2020-12-19 LAB — SEDIMENTATION RATE: Sed Rate: 52 mm/hr — ABNORMAL HIGH (ref 0–30)

## 2020-12-19 MED ORDER — HYDROCODONE-ACETAMINOPHEN 7.5-325 MG PO TABS
1.0000 | ORAL_TABLET | ORAL | 0 refills | Status: AC | PRN
Start: 2020-12-19 — End: ?

## 2020-12-19 MED ORDER — POLYETHYLENE GLYCOL 3350 17 G PO PACK: 17.0000 g | PACK | Freq: Every day | ORAL | 0 refills | Status: AC | PRN

## 2020-12-19 MED ORDER — ENOXAPARIN SODIUM 40 MG/0.4ML IJ SOSY: 40.0000 mg | PREFILLED_SYRINGE | INTRAMUSCULAR | 0 refills | Status: AC

## 2020-12-19 MED ORDER — TRAMADOL HCL 50 MG PO TABS: 50.0000 mg | ORAL_TABLET | Freq: Four times a day (QID) | ORAL | 0 refills | Status: AC | PRN

## 2020-12-19 MED ORDER — METHOCARBAMOL 500 MG PO TABS: 500.0000 mg | ORAL_TABLET | Freq: Four times a day (QID) | ORAL | 0 refills | Status: AC | PRN

## 2020-12-19 MED ORDER — DOCUSATE SODIUM 100 MG PO CAPS: 100.0000 mg | ORAL_CAPSULE | Freq: Two times a day (BID) | ORAL | 0 refills | Status: AC

## 2020-12-19 NOTE — Plan of Care (Signed)

## 2020-12-19 NOTE — Discharge Instructions (Signed)

## 2020-12-19 NOTE — ED Triage Notes (Signed)
Pt to ED for uncontrolled pain to right knee and fall this afternoon. Right knee replacement on Friday, discharged this afternoon from hospital.  Took pain meds PTA

## 2020-12-19 NOTE — ED Provider Notes (Signed)
  Emergency Medicine Provider Triage Evaluation Note  Karen Harvey , a 80 y.o.female,  was evaluated in triage.  Pt complains of knee pain.  Patient states that she had a knee replacement approximately 4 days prior.  Since then she has had increasing pain, as well as increasing swelling and warmth around her kneecap.  Denies fever/chills, chest pain, shortness of breath, urinary symptoms, or back pain.   Review of Systems  Positive: Knee pain Negative: Denies fever, chest pain, vomiting  Physical Exam   Vitals:   12/19/20 1843  BP: (!) 107/94  Pulse: 84  Resp: 18  Temp: 99 F (37.2 C)  SpO2: 94%   Gen:   Awake, no distress   Resp:  Normal effort  MSK:   Moves extremities without difficulty  Other:  Significant swelling, erythema, and tenderness surrounding the right knee.  Medical Decision Making  Given the patient's initial medical screening exam, the following diagnostic evaluation has been ordered. The patient will be placed in the appropriate treatment space, once one is available, to complete the evaluation and treatment. I have discussed the plan of care with the patient and I have advised the patient that an ED physician or mid-level practitioner will reevaluate their condition after the test results have been received, as the results may give them additional insight into the type of treatment they may need.    Diagnostics: Labs.  Treatments: none immediately   Teodoro Spray, [place] 12/19/20 1845    Nance Pear, MD 12/19/20 2020

## 2020-12-19 NOTE — Progress Notes (Signed)
   Subjective: 3 Days Post-Op Procedure(s) (LRB): TOTAL KNEE ARTHROPLASTY (Right) Patient reports pain as moderate.   Patient is well, and has had no acute complaints or problems Denies any CP, SOB, ABD pain. We will continue therapy today.  Plan is to go Home after hospital stay.  Objective: Vital signs in last 24 hours: Temp:  [97.5 F (36.4 C)-98.8 F (37.1 C)] 98.3 F (36.8 C) (12/12 0748) Pulse Rate:  [64-80] 80 (12/12 0748) Resp:  [15-18] 15 (12/12 0748) BP: (129-149)/(55-97) 146/97 (12/12 0748) SpO2:  [96 %-98 %] 96 % (12/12 0329)  Intake/Output from previous day: 12/11 0701 - 12/12 0700 In: -  Out: 350 [Urine:350] Intake/Output this shift: No intake/output data recorded.  Recent Labs    12/16/20 1621 12/17/20 0516 12/18/20 0945  HGB 14.4 12.7 12.0   Recent Labs    12/17/20 0516 12/18/20 0945  WBC 19.5* 12.9*  RBC 4.19 3.92  HCT 36.6 35.0*  PLT 265 207   Recent Labs    12/16/20 1621 12/17/20 0516  NA  --  137  K  --  4.3  CL  --  107  CO2  --  23  BUN  --  12  CREATININE 0.74 0.83  GLUCOSE  --  151*  CALCIUM  --  10.9*   No results for input(s): LABPT, INR in the last 72 hours.  EXAM General - Patient is Alert, Appropriate, and Oriented Extremity - Neurovascular intact Sensation intact distally Intact pulses distally Dorsiflexion/Plantar flexion intact Dressing - dressing C/D/I and no drainage, provena intact with out drainage Motor Function - intact, moving foot and toes well on exam.   Past Medical History:  Diagnosis Date   Anxiety    Arthritis    GERD (gastroesophageal reflux disease)    Heart murmur    Hyperlipidemia    Hypertension     Assessment/Plan:   3 Days Post-Op Procedure(s) (LRB): TOTAL KNEE ARTHROPLASTY (Right) Principal Problem:   S/P TKR (total knee replacement) using cement, right  Estimated body mass index is 29.27 kg/m as calculated from the following:   Height as of this encounter: 5\' 2"  (1.575 m).    Weight as of this encounter: 72.6 kg. Advance diet Up with therapy Pain controlled VSS Labs stable Good progress with PT CM to assist with discharge to home with HHPT today pending BM  DVT Prophylaxis - Lovenox, Foot Pumps, and TED hose Weight-Bearing as tolerated to right leg   T. Rachelle Hora, PA-C [place] 12/19/2020, 8:10 AM

## 2020-12-19 NOTE — TOC Progression Note (Signed)
Transition of Care [facility]) - Progression Note    Patient Details  Name: Karen Harvey MRN: [id number] Date of Birth: 1940-11-18  Transition of Care [facility]) CM/SW [place], RN Phone Number: 12/19/2020, 8:58 AM  Clinical Narrative:   Met with the patient this morning at the bedside She will DC today to home with her sister She has DME at home including [facility], and RW She can afford her medication She has transportation    Expected Discharge Plan: [place] Barriers to Discharge: Continued Medical Work up  Expected Discharge Plan and Services Expected Discharge Plan: [place] Choice: [place] arrangements for the past 2 months: Single Family Home Expected Discharge Date: 12/19/20               DME Arranged: Patient refused services ([facility] has a RW)         HH Arranged: PT, OT HH Agency: [place] Date HH Agency Contacted: 12/17/20 Time [place]: [place] Representative spoke with at [place]: [place] [employer]   Social Determinants of Health ([place]) Interventions    Readmission Risk Interventions No flowsheet data found.

## 2020-12-19 NOTE — Discharge Summary (Signed)
Physician Discharge Summary  Patient ID: Karen Harvey MRN: [id number] DOB/AGE: [birth year] 81 y.o.  Admit date: 12/16/2020 Discharge date: 12/19/2020  Admission Diagnoses:  S/P TKR (total knee replacement) using cement, right [Z96.651]   Discharge Diagnoses: Patient Active Problem List   Diagnosis Date Noted   S/P TKR (total knee replacement) using cement, 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    Past Medical History:  Diagnosis Date   Anxiety    Arthritis    GERD (gastroesophageal reflux disease)    Heart murmur    Hyperlipidemia    Hypertension      Transfusion: none   Consultants (if any):   Discharged Condition: Improved  Hospital Course: Karen Harvey is an 80 y.o. female who was admitted 12/16/2020 with a diagnosis of S/P TKR (total knee replacement) using cement, right and went to the operating room on 12/16/2020 and underwent the above named procedures.    Surgeries: Procedure(s): TOTAL KNEE ARTHROPLASTY on 12/16/2020 Patient tolerated the surgery well. Taken to PACU where she was stabilized and then transferred to the orthopedic floor.  Started on Lovenox 30 mg q 12 hrs. Foot pumps applied bilaterally at 80 mm. Heels elevated on bed with rolled towels. No evidence of DVT. Negative [place]. Physical therapy started on day #1 for gait training and transfer. OT started day #1 for ADL and assisted devices.  Patient's foley was d/c on day #1. Patient's IV was d/c on day #2.  On post op day #3 patient was stable and ready for discharge to home with HHPT.    She was given perioperative antibiotics:  Anti-infectives (From admission, onward)    Start     Dose/Rate Route Frequency Ordered Stop   12/16/20 1730  ceFAZolin (ANCEF) IVPB 2g/100 mL premix        2 g 200 mL/hr over 30 Minutes Intravenous Every 6 hours 12/16/20 1559 12/16/20 2355   12/16/20 1057  ceFAZolin (ANCEF) 2-4 GM/100ML-% IVPB       Note to Pharmacy: Doreen Salvage J: cabinet override      12/16/20 1057 12/16/20 1250    12/16/20 0600  ceFAZolin (ANCEF) IVPB 2g/100 mL premix        2 g 200 mL/hr over 30 Minutes Intravenous On call to O.R. 12/15/20 2235 12/16/20 1246     .  She was given sequential compression devices, early ambulation, and Lovenox TEDs for DVT prophylaxis.  She benefited maximally from the hospital stay and there were no complications.    Recent vital signs:  Vitals:   12/19/20 0329 12/19/20 0748  BP: (!) 149/59 (!) 146/97  Pulse: 72 80  Resp: 16 15  Temp: 98.8 F (37.1 C) 98.3 F (36.8 C)  SpO2: 96%     Recent laboratory studies:  Lab Results  Component Value Date   HGB 12.0 12/18/2020   HGB 12.7 12/17/2020   HGB 14.4 12/16/2020   Lab Results  Component Value Date   WBC 12.9 (H) 12/18/2020   PLT 207 12/18/2020   No results found for: INR Lab Results  Component Value Date   NA 137 12/17/2020   K 4.3 12/17/2020   CL 107 12/17/2020   CO2 23 12/17/2020   BUN 12 12/17/2020   CREATININE 0.83 12/17/2020   GLUCOSE 151 (H) 12/17/2020    Discharge Medications:   Allergies as of 12/19/2020   No Known Allergies      Medication List     STOP taking these medications    aspirin EC 81 MG tablet  TAKE these medications    atenolol 25 MG tablet Commonly known as: TENORMIN Take 12.5 mg by mouth daily. 0.5 tablet oral daily.   docusate sodium 100 MG capsule Commonly known as: COLACE Take 1 capsule (100 mg total) by mouth 2 (two) times daily.   enoxaparin 40 MG/0.4ML injection Commonly known as: LOVENOX Inject 0.4 mLs (40 mg total) into the skin daily for 14 days.   HYDROcodone-acetaminophen 7.5-325 MG tablet Commonly known as: NORCO Take 1-2 tablets by mouth every 4 (four) hours as needed for severe pain (pain score 7-10).   methocarbamol 500 MG tablet Commonly known as: ROBAXIN Take 1 tablet (500 mg total) by mouth every 6 (six) hours as needed for muscle spasms.   pantoprazole 40 MG tablet Commonly known as: PROTONIX Take 40 mg by mouth  daily.   polyethylene glycol 17 g packet Commonly known as: MIRALAX / GLYCOLAX Take 17 g by mouth daily as needed for mild constipation.   pravastatin 20 MG tablet Commonly known as: PRAVACHOL Take 20 mg by mouth at bedtime.   traMADol 50 MG tablet Commonly known as: ULTRAM Take 1 tablet (50 mg total) by mouth every 6 (six) hours as needed.               Durable Medical Equipment  (From admission, onward)           Start     Ordered   12/16/20 1600  DME Walker rolling  Once       Question Answer Comment  Walker: With 5 Inch Wheels   Patient needs a walker to treat with the following condition S/P TKR (total knee replacement) using cement, right      12/16/20 1559   12/16/20 1600  DME 3 n 1  Once        12/16/20 1559   12/16/20 1600  DME Bedside commode  Once       Question:  Patient needs a bedside commode to treat with the following condition  Answer:  S/P TKR (total knee replacement) using cement, right   12/16/20 1559            Diagnostic Studies: DG Knee 1-2 Views Right  Result Date: 12/16/2020 CLINICAL DATA:  Right total knee arthroplasty EXAM: RIGHT KNEE - 1-2 VIEW COMPARISON:  None. FINDINGS: Changes of right knee replacement. Soft tissue and joint space gas. No hardware bony complicating feature. IMPRESSION: Right knee replacement.  No visible complicating feature. Electronically Signed   By: Rolm Baptise M.D.   On: 12/16/2020 15:00    Disposition: Discharge disposition: 06-Home-Health Care Svc          Follow-up Information     Duanne Guess, PA-C Follow up in 2 week(s).   Specialties: Orthopedic Surgery, Emergency Medicine Contact information: [place] [address] [phone]                  Signed: Feliberto Gottron 12/19/2020, 8:19 AM

## 2020-12-19 NOTE — TOC Progression Note (Addendum)
Transition of Care [facility]) - Progression Note    Patient Details  Name: SERI KIMMER MRN: [id number] Date of Birth: 06-29-1940  Transition of Care [facility]) CM/SW [place], RN Phone Number: 12/19/2020, 10:55 AM  Clinical Narrative:   Spoke with the patient at the bedside, I explained that Pt was recommending that she go to [facility] SNF, she stated that she is not going to do that.  She stated that she would need EMS to transport her home, I explained that she would have to have someone at her home for that to happen, She stated that her sister is waiting on her there, will arrange EMS transport after she has a Bowel Movement Update, patient's sister to pick up   Expected Discharge Plan: [place] Barriers to Discharge: Continued Medical Work up  Expected Discharge Plan and Services Expected Discharge Plan: [place] Choice: [place] arrangements for the past 2 months: Single Family Home Expected Discharge Date: 12/19/20               DME Arranged: Patient refused services ([place] has a RW)         HH Arranged: PT, OT HH Agency: [place] Date [facility] Agency Contacted: 12/17/20 Time [place]: [place] Representative spoke with at [place]: [place] [employer]   Social Determinants of Health ([place]) Interventions    Readmission Risk Interventions No flowsheet data found.

## 2020-12-19 NOTE — Progress Notes (Signed)
Physical Therapy Treatment Patient Details Name: Karen Harvey MRN: [id number] DOB: [birth year] Today's Date: 12/19/2020   History of Present Illness Pt is an 80 y.o. female s/p R TKA 12/9 secondary to OA.  PMH includes htn, anxiety, HLD.    PT Comments    Pt sitting on BSC upon PT arrival; agreeable to PT session.  SBA with transfers using RW; CGA to SBA with ambulation 80 feet with RW (vc's to stay closer to RW and to increase UE support through RW to offweight R LE required).  Pt reporting 0/10 pain at rest but R knee pain increased to 8-10/10 with activity (pt received pain meds prior to session).  Pt min assist navigating 4 steps with L railing and mod assist to navigate 1 platform step with RW; 2nd assist required for safety during stairs training d/t pt with difficulty following cues for safe technique.  After performing platform step pt appearing distracted and with 2 loss of balance (even with RW present for support) requiring min to mod assist for balance.  Pt noted with difficulty following cues for activities during session--TOC and nurse notified of this and pt's difficulty with stairs and balance concerns.  D/t current status, assist levels, and concerns for falls, PT discharge recommendation updated to SNF (TOC and nurse notified).  Pt requesting to discharge home instead and reports plan for EMS transport home (pt educated to avoid stairs unless she has appropriate physical assist for safety--pt reports once EMS brings her into home, she can stay on main level and will put [facility] next to recliner (pt plans to sleep in recliner) to use for toileting and won't get up unless she has family present.    Recommendations for follow up therapy are one component of a multi-disciplinary discharge planning process, led by the attending physician.  Recommendations may be updated based on patient status, additional functional criteria and insurance authorization.  Follow Up Recommendations  Skilled  nursing-short term rehab (<3 hours/day)     Assistance Recommended at Discharge Set up [place] Recommendations  Rolling walker (2 wheels);BSC/3in1    Recommendations for Other Services OT consult     Precautions / Restrictions Precautions Precautions: Knee;Fall Precaution Booklet Issued: Yes (comment) Precaution Comments: wound vac Restrictions Weight Bearing Restrictions: Yes RLE Weight Bearing: Weight bearing as tolerated     Mobility  Bed Mobility               General bed mobility comments: Pt on BSC beginning of session and in recliner end of session    Transfers Overall transfer level: Needs assistance Equipment used: Rolling walker (2 wheels) Transfers: Sit to/from Stand Sit to Stand: Supervision           General transfer comment: vc's for UE/LE placement and positioning with transfers    Ambulation/Gait Ambulation/Gait assistance: Min guard;Supervision Gait Distance (Feet): 80 Feet Assistive device: Rolling walker (2 wheels) Gait Pattern/deviations: Antalgic;Trunk flexed;Narrow base of support;Decreased stride length;Decreased step length - left;Decreased stance time - right Gait velocity: decreased     General Gait Details: Limited distance d/t R knee pain; intermittent vc's required for pt to stay closer to walker and to increase UE support through RW to offweight R LE during ambulation (d/t increased R knee pain with [place] activities).   Stairs Stairs: Yes Stairs assistance: Min assist;Mod assist;+2 safety/equipment Stair Management: One rail Left;Step to pattern;Forwards Number of Stairs: 4 General stair comments: ascended/descended 4 steps with L railing (min assist) and 1 platform step  with RW (mod assist); pt requiring vc's for overall technique and LE sequencing   Wheelchair Mobility    Modified Rankin (Stroke Patients Only)       Balance Overall balance assessment: Needs assistance Sitting-balance  support: No upper extremity supported;Feet supported Sitting balance-Leahy Scale: Good Sitting balance - Comments: steady sitting reaching within BOS     Standing balance-Leahy Scale: Poor Standing balance comment: pt steady at times but after performing platform step pt with 2 loss of balance in standing requiring min to mod assist for balance (pt appearing distracted in standing with RW for support)                            Cognition Arousal/Alertness: Awake/alert Behavior During Therapy: Impulsive                                   General Comments: A&O to person, place, time, and general situation.  Pt noted with difficulty following simple commands and impulsive at times.        Exercises Total Joint Exercises Goniometric ROM: 5-85 degrees    General Comments General comments (skin integrity, edema, etc.): L knee dressing, wound vac, and polar care in place.      Pertinent Vitals/Pain Pain Assessment: 0-10 Faces Pain Scale: No hurt (8-10/10 with activity) Pain Location: R knee Pain Descriptors / Indicators: Aching;Sore Pain Intervention(s): Limited activity within patient's tolerance;Monitored during session;Premedicated before session;Repositioned Vitals (HR and O2 on room air) stable and WFL throughout treatment session.    Home Living                          Prior Function            PT Goals (current goals can now be found in the care plan section) Acute Rehab PT Goals Patient Stated Goal: get better and go home PT Goal Formulation: With patient Time For Goal Achievement: 12/30/20 Potential to Achieve Goals: Fair Progress towards PT goals: Progressing toward goals    Frequency    BID      PT Plan Current plan remains appropriate    Co-evaluation              AM-PAC PT "6 Clicks" Mobility   Outcome Measure  Help needed turning from your back to your side while in a flat bed without using bedrails?:  None Help needed moving from lying on your back to sitting on the side of a flat bed without using bedrails?: A Little Help needed moving to and from a bed to a chair (including a wheelchair)?: A Little Help needed standing up from a chair using your arms (e.g., wheelchair or bedside chair)?: A Little Help needed to walk in hospital room?: A Little Help needed climbing 3-5 steps with a railing? : Total 6 Click Score: 17    End of Session Equipment Utilized During Treatment: Gait belt Activity Tolerance: Patient limited by pain Patient left: in chair;with call bell/phone within reach;with chair alarm set;Other (comment) (B heels floating via towel roll; polar care in place) Nurse Communication: Mobility status;Precautions;Weight bearing status (pt's difficulty following commands, difficulty with stairs, and loss of balance) PT Visit Diagnosis: Other abnormalities of gait and mobility (R26.89);Muscle weakness (generalized) (M62.81);Difficulty in walking, not elsewhere classified (R26.2);Pain Pain - Right/Left: Right Pain - part of body: Knee  Time: 3073-5430 PT Time Calculation (min) (ACUTE ONLY): 38 min  Charges:  $Gait Training: 23-37 mins $Therapeutic Activity: 8-22 mins                    Leitha Bleak, PT 12/19/20, 11:42 AM

## 2020-12-19 NOTE — Progress Notes (Signed)
I explained to the patient that she has not had a bowel movement. I offered Miralx. The patietn stated  that does not work for her. I then offered a suppository and the patient stated I do not want that. I will offer both options again.

## 2020-12-20 ENCOUNTER — Emergency Department
Admission: EM | Admit: 2020-12-20 | Discharge: 2020-12-22 | Disposition: A | Discharge status: Home / Self Care | Payer: Medicare HMO | Source: Home / Self Care | Attending: Emergency Medicine | Admitting: Emergency Medicine

## 2020-12-20 DIAGNOSIS — M25561 Pain in right knee: Secondary | ICD-10-CM

## 2020-12-20 MED ORDER — PRAVASTATIN SODIUM 20 MG PO TABS
20.0000 mg | ORAL_TABLET | Freq: Every day | ORAL | Status: DC
  Administered 2020-12-20 – 2020-12-21 (×2): 20 mg via ORAL
  Filled 2020-12-20 (×2): qty 1

## 2020-12-20 MED ORDER — HYDROCODONE-ACETAMINOPHEN 7.5-325 MG PO TABS
1.0000 | ORAL_TABLET | ORAL | Status: DC | PRN
Start: 2020-12-20 — End: 2020-12-22
  Administered 2020-12-20 – 2020-12-21 (×4): 1 via ORAL
  Filled 2020-12-20 (×2): qty 2
  Filled 2020-12-20 (×3): qty 1

## 2020-12-20 MED ORDER — ATENOLOL 25 MG PO TABS
12.5000 mg | ORAL_TABLET | Freq: Every day | ORAL | Status: DC
  Administered 2020-12-20 – 2020-12-22 (×3): 12.5 mg via ORAL
  Filled 2020-12-20 (×3): qty 1

## 2020-12-20 MED ORDER — TRAMADOL HCL 50 MG PO TABS
50.0000 mg | ORAL_TABLET | Freq: Four times a day (QID) | ORAL | Status: DC | PRN
  Administered 2020-12-21: 19:00:00 50 mg via ORAL
  Filled 2020-12-20: qty 1

## 2020-12-20 MED ORDER — POLYETHYLENE GLYCOL 3350 17 G PO PACK: 17.0000 g | PACK | Freq: Every day | ORAL | Status: DC | PRN

## 2020-12-20 MED ORDER — PANTOPRAZOLE SODIUM 40 MG PO TBEC
40.0000 mg | DELAYED_RELEASE_TABLET | Freq: Every day | ORAL | Status: DC
  Administered 2020-12-20 – 2020-12-22 (×3): 40 mg via ORAL
  Filled 2020-12-20 (×3): qty 1

## 2020-12-20 MED ORDER — DOCUSATE SODIUM 100 MG PO CAPS
100.0000 mg | ORAL_CAPSULE | Freq: Two times a day (BID) | ORAL | Status: DC
  Administered 2020-12-20 – 2020-12-22 (×5): 100 mg via ORAL
  Filled 2020-12-20 (×5): qty 1

## 2020-12-20 MED ORDER — ENOXAPARIN SODIUM 40 MG/0.4ML IJ SOSY
40.0000 mg | PREFILLED_SYRINGE | INTRAMUSCULAR | Status: DC
  Administered 2020-12-20 – 2020-12-22 (×3): 40 mg via SUBCUTANEOUS
  Filled 2020-12-20 (×3): qty 0.4

## 2020-12-20 MED ORDER — METHOCARBAMOL 500 MG PO TABS
500.0000 mg | ORAL_TABLET | Freq: Four times a day (QID) | ORAL | Status: DC | PRN
  Filled 2020-12-20: qty 1

## 2020-12-20 NOTE — ED Notes (Signed)
Knee replacement on Friday of last week.

## 2020-12-20 NOTE — ED Provider Notes (Signed)
[facility] Emergency Department Provider Note ____________________________________________   Event Date/Time   First MD Initiated Contact with Patient 12/20/20 [phone]     (approximate)  I have reviewed the triage vital signs and the nursing notes.   HISTORY  Chief Complaint Fall and Pain Management    HPI Karen Harvey is a 80 y.o. 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  The patient reported a fall at triage although tells me that she just bumped her knee against something and did not actually fall onto it.  She denies any acute complaints other than knee pain and difficulty walking.  Her friend [name] who has been helping take care of her states that she lives alone and has not been able to complete ADLs such as putting pellets in her wood-burning heater.  He is concerned for her safety and does not feel that she can go home.  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    Past Surgical History:  Procedure Laterality Date   CATARACT EXTRACTION W/PHACO Right 03/10/2019   Procedure: CATARACT EXTRACTION PHACO AND INTRAOCULAR LENS PLACEMENT ([place]) RIGHT 9.87  01:01.6;  Surgeon: Birder Robson, MD;  Location: [facility];  Service: Ophthalmology;  Laterality: Right;   COLONOSCOPY W/ POLYPECTOMY  11/06/2012, 07/14/1997   adenomatous polyp   COLONOSCOPY WITH PROPOFOL N/A 03/03/2018   Procedure: COLONOSCOPY WITH PROPOFOL;  Surgeon: Manya Silvas, MD;  Location: [facility] ENDOSCOPY;  Service: Endoscopy;  Laterality: N/A;   TOTAL KNEE ARTHROPLASTY Right 12/16/2020   Procedure: TOTAL  KNEE ARTHROPLASTY;  Surgeon: Hessie Knows, MD;  Location: [facility] ORS;  Service: Orthopedics;  Laterality: Right;  RNFA needed    Prior to Admission medications   Medication Sig Start Date End Date Taking? Authorizing Provider  atenolol (TENORMIN) 25 MG tablet Take 12.5 mg by mouth daily. 0.5 tablet oral daily.   Yes [provider]  docusate sodium (COLACE) 100 MG capsule Take 1 capsule (100 mg total) by mouth 2 (two) times daily. 12/19/20  Yes Duanne Guess, PA-C  enoxaparin (LOVENOX) 40 MG/0.4ML injection Inject 0.4 mLs (40 mg total) into the skin daily for 14 days. 12/19/20 01/02/21 Yes Duanne Guess, PA-C  HYDROcodone-acetaminophen (NORCO) 7.5-325 MG tablet Take 1-2 tablets by mouth every 4 (four) hours as needed for severe pain (pain score 7-10). 12/19/20  Yes Duanne Guess, PA-C  methocarbamol (ROBAXIN) 500 MG tablet Take 1 tablet (500 mg total) by mouth every 6 (six) hours as needed for muscle spasms. 12/19/20  Yes Duanne Guess, PA-C  pantoprazole (PROTONIX) 40 MG tablet Take 40 mg by mouth daily.   Yes [provider]  polyethylene glycol (MIRALAX / GLYCOLAX) 17 g packet Take 17 g by mouth daily as needed for mild constipation. 12/19/20  Yes Duanne Guess, PA-C  pravastatin (PRAVACHOL) 20 MG tablet Take 20 mg by mouth at bedtime.   Yes [provider]  traMADol (ULTRAM) 50 MG tablet Take 1 tablet (50 mg total) by mouth every 6 (six) hours as needed. 12/19/20  Yes Arvella Nigh,  Thomas C, PA-C  mirtazapine (REMERON) 7.5 MG tablet Take 7.5 mg by mouth at bedtime. 11/01/20   [provider]    Allergies Patient has no known allergies.  No family history on file.  Social History Social History   Tobacco Use   Smoking status: Never   Smokeless tobacco: Never  Vaping Use   Vaping Use: Never used  Substance Use Topics   Alcohol use: Never   Drug use: Never    Review of Systems  Constitutional: No fever/chills Eyes: No visual  changes. ENT: No sore throat. Cardiovascular: Denies chest pain. Respiratory: Denies shortness of breath. Gastrointestinal: No nausea, no vomiting.  No diarrhea.  Genitourinary: Negative for dysuria.  Musculoskeletal: Negative for back pain.  Positive for right knee pain. Skin: Negative for rash. Neurological: Negative for headaches, focal weakness or numbness.   ____________________________________________   PHYSICAL EXAM:  VITAL SIGNS: ED Triage Vitals [12/19/20 1843]  Enc Vitals Group     BP (!) 107/94     Pulse Rate 84     Resp 18     Temp 99 F (37.2 C)     Temp Source Oral     SpO2 94 %     Weight 160 lb 15 oz (73 kg)     Height 5\' 2"  (1.575 m)     Head Circumference      Peak Flow      Pain Score 0     Pain Loc      Pain Edu?      Excl. in [place]?     Constitutional: Alert and oriented. Well appearing and in no acute distress. Eyes: Conjunctivae are normal.  Head: Atraumatic. Nose: No congestion/rhinnorhea. Mouth/Throat: Mucous membranes are moist.   Neck: Normal range of motion.  Cardiovascular: Normal rate, regular rhythm. Good peripheral circulation. Respiratory: Normal respiratory effort.  No retractions.  Gastrointestinal: No distention.  Musculoskeletal: Extremities warm and well perfused.  Right knee with wound VAC in place, margins clean and dry.  Right knee and lower leg warmer when compared to left with mild edema but no rash, erythema, or induration.  Patient able to bend the knee approximately 30 degrees; ROM limited by pain.  2+ DP pulse. Neurologic:  Normal speech and language. Intact distal motor and fine touch sensation in right leg. Skin:  Skin is warm and dry. No rash noted. Psychiatric: Mood and affect are normal. Speech and behavior are normal.  ____________________________________________   LABS (all labs ordered are listed, but only abnormal results are displayed)  Labs Reviewed  CBC - Abnormal; Notable for the following components:       Result Value   WBC 12.8 (*)    Hemoglobin 11.9 (*)    HCT 34.7 (*)    All other components within normal limits  BASIC METABOLIC PANEL - Abnormal; Notable for the following components:   Sodium 132 (*)    Glucose, Bld 134 (*)    Calcium 11.2 (*)    All other components within normal limits  SEDIMENTATION RATE - Abnormal; Notable for the following components:   Sed Rate 52 (*)    All other components within normal limits  CBC WITH DIFFERENTIAL/PLATELET - Abnormal; Notable for the following components:   WBC 12.7 (*)    RBC 3.84 (*)    Hemoglobin 11.9 (*)    HCT 34.4 (*)    Neutro Abs 9.7 (*)    Monocytes Absolute 1.2 (*)    All other components within normal  limits   ____________________________________________  EKG   ____________________________________________  RADIOLOGY  XR R knee: IMPRESSION:  Resolving postoperative changes on radiograph. Skin staples  remaining in place over the knee following total knee arthroplasty.   ____________________________________________   PROCEDURES  Procedure(s) performed: No  Procedures  Critical Care performed: No ____________________________________________   INITIAL IMPRESSION / ASSESSMENT AND PLAN / ED COURSE  Pertinent labs & imaging results that were available during my care of the patient were reviewed by me and considered in my medical decision making (see chart for details).   80 year old female with PMH as noted above and status post right total knee arthroplasty on 12/9 presents with continued pain and inability to complete ADLs.  She reported a fall in triage but to me she states that she just bumped the knee against something.  Her son states that she lives alone and is not safe to go home.  I reviewed the past medical records in epic.  The patient was discharged yesterday.  At that time A [facility] evaluation reported that the patient would be taken care of at home by her sister and was arranged for home health.  The  patient had declined SNF placement at that time.  On exam the vital signs are normal.  The patient is afebrile.  She has no erythema, induration, or other concerning findings to the right leg and the wound VAC is in place.  There is no evidence of postoperative infection or other complication, and there is no evidence of traumatic injury on x-rays today.  Patient will need TOC evaluation for SNF placement.  I have ordered her daily medications as per the discharge summary.  ----------------------------------------- 1:11 PM on 12/20/2020 -----------------------------------------  I was informed by the RN that the patient was upset and yelling at her that she wanted to leave.  She is stating that she still has knee pain (although she has not requested any prn pain medication).  She has gotten dressed and is stating that she will leave.  However, on further questioning she does not have any clear plan of how to get home or how she will take care of herself when she gets there.  She will not engage with me in any meaningful discussion about her plan of care, other than demanding to leave and stating she will follow up with the orthopedic office and physical therapy.  I attempted to again explain to her that we were waiting for social work evaluation to help get her to a rehab facility.  The patient accuses [place] of lying to her, called me a "smartass" and made threats, stating, "I have my rights.  I have good lawyers and I know how to use them, big boy."   We were able to get the patient's friend Leonor Liv (who accompanied her this morning) on the phone who agrees to come talk to her, and the patient agrees to wait until he arrives.    ----------------------------------------- 3:15 PM on 12/20/2020 -----------------------------------------  [name] returned and was able to reassure the patient and convince her that it is unsafe to go home.  She now agrees with the plan of SNF placement.  He also mentions that she  may have dementia, and this is consistent with her behavior.  LCSW was in to see the patient and is actively working on arrangements for placement.  I also called Dr. Rudene Christians, who did the patient's surgery last week, and discussed my exam findings with him.  He agreed that there was no  indication for further work-up and that she would benefit most from SNF placement for rehab.  I have signed the patient out to the oncoming ED physician Dr. Charna Archer.  Plan will be to continue to await SNF placement.    ____________________________________________   FINAL CLINICAL IMPRESSION(S) / ED DIAGNOSES  Final diagnoses:  Right knee pain, unspecified chronicity      NEW MEDICATIONS STARTED DURING THIS VISIT:  New Prescriptions   No medications on file     Note:  This document was prepared using Dragon voice recognition software and may include unintentional dictation errors.    Arta Silence, MD 12/20/20 1517

## 2020-12-20 NOTE — NC FL2 (Signed)
[place] LEVEL OF CARE SCREENING TOOL     IDENTIFICATION  Patient Name: Karen Harvey Birthdate: 02-07-1940 Sex: female Admission Date (Current Location): 12/20/2020  [place] [facility] and [place] Number:  Engineering geologist and Address:  [facility], [address] [place], [place] [address]      Provider Number: [id number]  Attending Physician Name and Address:  Arta Silence, MD  Relative Name and Phone Number:  Nicholos Johns (sister) [phone]    Current Level of Care: Hospital Recommended Level of Care: [place] Prior Approval Number:    Date Approved/Denied:   PASRR Number: [id number]  Discharge Plan: SNF    Current Diagnoses: Patient Active Problem List   Diagnosis Date Noted   S/P TKR (total knee replacement) using cement, 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    Orientation RESPIRATION BLADDER Height & Weight     Self, Situation, Place  Normal Continent Weight: 73 kg Height:  5\' 2"  (157.5 cm)  BEHAVIORAL SYMPTOMS/MOOD NEUROLOGICAL BOWEL NUTRITION STATUS      Continent Diet (Regular)  AMBULATORY STATUS COMMUNICATION OF NEEDS Skin   Limited Assist Verbally Wound Vac, Surgical wounds (left knee surgical incision and wound vac after total knee)                       Personal Care Assistance Level of Assistance  Bathing, Feeding, Dressing Bathing Assistance: Limited assistance Feeding assistance: Limited assistance Dressing Assistance: Limited assistance     Functional Limitations Info  Sight, Hearing, Speech Sight Info: Adequate Hearing Info: Adequate Speech Info: Adequate    SPECIAL CARE FACTORS FREQUENCY  PT (By licensed PT), OT (By licensed OT)     PT Frequency: 5 times per week OT Frequency: 5 times per weeek            Contractures Contractures Info: Not present    Additional Factors Info  Code Status, Allergies Code Status Info: Full Allergies Info: NKA           Current  Medications (12/20/2020):  This is the current hospital active medication list Current Facility-Administered Medications  Medication Dose Route Frequency Provider Last Rate Last Admin   atenolol (TENORMIN) tablet 12.5 mg  12.5 mg Oral Daily Arta Silence, MD   12.5 mg at 12/20/20 0905   docusate sodium (COLACE) capsule 100 mg  100 mg Oral BID Arta Silence, MD   100 mg at 12/20/20 0905   enoxaparin (LOVENOX) injection 40 mg  40 mg Subcutaneous Q24H Arta Silence, MD   40 mg at 12/20/20 0751   HYDROcodone-acetaminophen (NORCO) 7.5-325 MG per tablet 1-2 tablet  1-2 tablet Oral Q4H PRN Arta Silence, MD   1 tablet at 12/20/20 1404   methocarbamol (ROBAXIN) tablet 500 mg  500 mg Oral Q6H PRN Arta Silence, MD       pantoprazole (PROTONIX) EC tablet 40 mg  40 mg Oral Daily Arta Silence, MD   40 mg at 12/20/20 4627   polyethylene glycol (MIRALAX / GLYCOLAX) packet 17 g  17 g Oral Daily PRN Arta Silence, MD       pravastatin (PRAVACHOL) tablet 20 mg  20 mg Oral QHS Arta Silence, MD       traMADol Veatrice Bourbon) tablet 50 mg  50 mg Oral Q6H PRN Arta Silence, MD       Current Outpatient Medications  Medication Sig Dispense Refill   atenolol (TENORMIN) 25 MG tablet Take 12.5 mg by mouth daily. 0.5 tablet oral daily.  docusate sodium (COLACE) 100 MG capsule Take 1 capsule (100 mg total) by mouth 2 (two) times daily. 10 capsule 0   enoxaparin (LOVENOX) 40 MG/0.4ML injection Inject 0.4 mLs (40 mg total) into the skin daily for 14 days. 5.6 mL 0   HYDROcodone-acetaminophen (NORCO) 7.5-325 MG tablet Take 1-2 tablets by mouth every 4 (four) hours as needed for severe pain (pain score 7-10). 30 tablet 0   methocarbamol (ROBAXIN) 500 MG tablet Take 1 tablet (500 mg total) by mouth every 6 (six) hours as needed for muscle spasms. 30 tablet 0   pantoprazole (PROTONIX) 40 MG tablet Take 40 mg by mouth daily.     polyethylene glycol (MIRALAX / GLYCOLAX) 17 g  packet Take 17 g by mouth daily as needed for mild constipation. 14 each 0   pravastatin (PRAVACHOL) 20 MG tablet Take 20 mg by mouth at bedtime.     traMADol (ULTRAM) 50 MG tablet Take 1 tablet (50 mg total) by mouth every 6 (six) hours as needed. 30 tablet 0   mirtazapine (REMERON) 7.5 MG tablet Take 7.5 mg by mouth at bedtime.       Discharge Medications: Please see discharge summary for a list of discharge medications.  Relevant Imaging Results:  Relevant Lab Results:   Additional Information SS# [id number]  Shelbie Hutching, RN

## 2020-12-20 NOTE — TOC Initial Note (Signed)
Transition of Care [facility]) - Initial/Assessment Note    Patient Details  Name: Karen Harvey MRN: [id number] Date of Birth: 1940-03-11  Transition of Care [facility]) CM/SW Contact:    Shelbie Hutching, RN Phone Number: 12/20/2020, 2:23 PM  Clinical Narrative:                 Patient discharged from the hospital yesterday after having total knee arthroplasty on 12/9.  Patient refused SNF at discharge and was set up with home health services through [facility].  Patient got home last night and was unable to walk, family had to call EMS to bring her back to the hospital.  [facility] consult for SNF placement.  Patient is agitated and wants to go home and get therapy, she finally agrees to let this RNCM start bed search for short term rehab.  Patient is from home alone.  She has a sister and a good friend [name].  [name] is at the bedside currently and encouraging the patient to go to rehab.    Expected Discharge Plan: Skilled Nursing Facility Barriers to Discharge: SNF Pending bed offer   Patient Goals and CMS Choice Patient states their goals for this hospitalization and ongoing recovery are:: Patient wants to get her knee better- wants therapy tostart CMS Medicare.gov Compare Post Acute Care list provided to:: Patient Choice offered to / list presented to : Patient  Expected Discharge Plan and Services Expected Discharge Plan: [place]   Discharge Planning Services: CM Consult Post Acute Care Choice: [place] Living arrangements for the past 2 months: Single Family Home                 DME Arranged: N/A DME Agency: NA       HH Arranged: NA HH Agency: NA        Prior Living Arrangements/Services Living arrangements for the past 2 months: Single Family Home Lives with:: Self Patient language and need for interpreter reviewed:: Yes Do you feel safe going back to the place where you live?: Yes      Need for Family Participation in Patient Care: Yes (Comment)  (knee replacement) Care giver support system in place?: Yes (comment) (sister and friend) Current home services: DME, Home PT (RW) Criminal Activity/Legal Involvement Pertinent to Current Situation/Hospitalization: No - Comment as needed  Activities of Daily Living      Permission Sought/Granted Permission sought to share information with : Case Manager, Customer service manager, Family Supports Permission granted to share information with : Yes, Verbal Permission Granted  Share Information with NAME: Nicholos Johns  Permission granted to share info w AGENCY: SNF's  Permission granted to share info w Relationship: sister  Permission granted to share info w Contact Information: [phone]  Emotional Assessment Appearance:: Appears stated age Attitude/Demeanor/Rapport: Angry, Complaining Affect (typically observed): Frustrated, In denial, Defensive Orientation: : Oriented to Self, Oriented to Place, Oriented to Situation Alcohol / Substance Use: Not Applicable Psych Involvement: No (comment)  Admission diagnosis:  Fall Patient Active Problem List   Diagnosis Date Noted   S/P TKR (total knee replacement) using cement, 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

## 2020-12-20 NOTE — ED Notes (Signed)
Family friend, [name], at bedside speaking with patient and Siadecki, MD.

## 2020-12-20 NOTE — ED Notes (Signed)
PT at bedside.

## 2020-12-20 NOTE — ED Notes (Signed)
OT at bedside working with patient

## 2020-12-20 NOTE — ED Notes (Signed)
Patient bed wet. Purewick not in place. Patient bedding changed and purewick replaced. Lunch tray at bedside and patient eating at this time.

## 2020-12-20 NOTE — ED Notes (Signed)
Presents to the ER with complaints of knee pain and unable to walk on her R knee. Discharged yesterday from the hospital after having a total knee replacement on 12/16/2020. Pt states that she lost her balance and fell backwards bumping the wall. She remained on her feet. R knee wound vac with no drainage in the pump. Dressing intact. Skin around R knee red, swollen, and warm to touch. Caregiver in room states that they are concerned about her going home, he does not believe that she can care for herself at home if she is unable to walk and bear weight on R leg.   Karen Harvey states that she does not have pain at rest, only when she is walking.   A&Ox4. Skin p/w/d. RR even and non-labored.

## 2020-12-20 NOTE — Evaluation (Signed)
Occupational Therapy Evaluation Patient Details Name: Karen Harvey MRN: [id number] DOB: [birth year] Today's Date: 12/20/2020   History of Present Illness Pt is an 80 y.o. 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 Pt came to the ED on 12/20/20 with complaints of knee pain and unable to walk on her R knee. Pt states that she lost her balance and fell backwards bumping the wall.   Clinical Impression   Pt seen for OT evaluation. Pt discharged home, came back to ED next date after near fall and unable to care for herself at home safely. Pt received in recliner, eager to participate. Family and sitter present. Pt endorses discomfort. (RN reports that pt needed significant encouragement to take pain meds prior to session to address pain). Pt demo's difficulty providing OT with pain score and dismisses pain but then later during session notes that no one else can imagine how much pain she is having. Pt demonstrating impaired insight into deficits and safety. Pt required MIN-MOD A for ADL transfers and MIN A to correct 1 moderate LOB with RW. Pt unsafe to return home at this time. Recommend SNF for short term rehab in order to maximize pt's return to PLOF and minimize falls risk and increased caregiver burden.      Recommendations for follow up therapy are one component of a multi-disciplinary discharge planning process, led by the attending physician.  Recommendations may be updated based on patient status, additional functional criteria and insurance authorization.   Follow Up Recommendations  Skilled nursing-short term rehab (<3 hours/day)    Assistance Recommended at Discharge Frequent or constant Supervision/Assistance  Functional Status Assessment  Patient has had a recent decline in their functional status and demonstrates the ability to make significant improvements in function in a reasonable and predictable amount of time.  Equipment  Recommendations  None recommended by OT    Recommendations for Other Services       Precautions / Restrictions Precautions Precautions: Knee;Fall Precaution Comments: wound vac Restrictions Weight Bearing Restrictions: Yes RLE Weight Bearing: Weight bearing as tolerated      Mobility Bed Mobility               General bed mobility comments: NT,  up in recliner at start and end of session    Transfers Overall transfer level: Needs assistance Equipment used: Rolling walker (2 wheels) Transfers: Sit to/from Stand Sit to Stand: Min assist           General transfer comment: VC for hand/foot placement, RW mgt, and safety      Balance Overall balance assessment: Needs assistance Sitting-balance support: No upper extremity supported;Feet supported Sitting balance-Leahy Scale: Good     Standing balance support: Single extremity supported;Bilateral upper extremity supported;No upper extremity supported;During functional activity;Reliant on assistive device for balance Standing balance-Leahy Scale: Poor Standing balance comment: moderate LOB in standing requiring MIN A to correct, able to maintain static standing balance with LUE support vs no UE support to briefly complete donning LB dressing over hips after toileting. Heavily reliant on RW for ADL mobility                           ADL either performed or assessed with clinical judgement   ADL Overall ADL's : Needs assistance/impaired     Grooming: Standing;Min guard;Cueing for safety               Lower  Body Dressing: Sit to/from stand;Minimal assistance;Moderate assistance;Cueing for safety   Toilet Transfer: Moderate assistance;Regular Toilet;Ambulation;Rolling walker (2 wheels) Toilet Transfer Details (indicate cue type and reason): MOD A from std height toilet, MAX VC for hand placement for safety, BSC placed in room after session for nursing to use with pt Toileting- Clothing Manipulation  and Hygiene: Supervision/safety;Sitting/lateral lean               Vision         Perception     Praxis      Pertinent Vitals/Pain Pain Assessment: Faces Faces Pain Scale: Hurts even more Pain Location: R knee; pt does not verbalize using Likert scale only states that her knee hurts really bad with mobility but then reclines pain meds. (RN reports encouragement needed to give her pain meds earlier) Pain Descriptors / Indicators: Aching;Sore Pain Intervention(s): Limited activity within patient's tolerance;Monitored during session;Premedicated before session;Repositioned;Ice applied     Hand Dominance Right   Extremity/Trunk Assessment Upper Extremity Assessment Upper Extremity Assessment: Generalized weakness   Lower Extremity Assessment Lower Extremity Assessment: Generalized weakness;RLE deficits/detail RLE Deficits / Details: s/p recent TKA with expected impairments in strength and ROM, limited knee extension noted, not formally measured       Communication Communication Communication: No difficulties   Cognition Arousal/Alertness: Awake/alert Behavior During Therapy: WFL for tasks assessed/performed                                   General Comments: Pt alert and oriented, however does demonstrate decreased awareness of deficits and safety. Requires VC for safety with all aspects of mobility. Pt endorses pain with weightbearing but then declines pain meds stating "I'm fine." Family member in and out during session.     General Comments       Exercises Other Exercises Other Exercises: pt/family instructed in home/routines modifications, falls prevention, toilet transfers, RW mgt, and need for additional therapy and assist initially to maximize safety   Shoulder Instructions      Home Living Family/patient expects to be discharged to:: Private residence Living Arrangements: Alone Available Help at Discharge: Family;Available  PRN/intermittently Type of Home: House Home Access: Stairs to enter [employer] of Steps: 3-4 Entrance Stairs-Rails: Left Home Layout: Two level;Able to live on main level with bedroom/bathroom Alternate Level Stairs-Number of Steps: 1 step down to bathroom (no railing but has wall for support)   Bathroom Shower/Tub: Teacher, early years/pre: Standard     Home Equipment: Conservation officer, nature (2 wheels);BSC/3in1;Cane - quad          Prior Functioning/Environment Prior Level of Function : Independent/Modified Independent             Mobility Comments: No recent falls ADLs Comments: Pt reports she was independent with self care, IADLs, including yardwork and driving prior to admission        OT Problem List: Decreased strength;Decreased knowledge of use of DME or AE;Decreased range of motion;Decreased activity tolerance;Impaired balance (sitting and/or standing);Pain;Decreased safety awareness;Decreased knowledge of precautions      OT Treatment/Interventions: Self-care/ADL training;Therapeutic exercise;Patient/family education;Balance training;Therapeutic activities;DME and/or AE instruction;Cognitive remediation/compensation    OT Goals(Current goals can be found in the care plan section) Acute Rehab OT Goals Patient Stated Goal: get stronger and go home OT Goal Formulation: With patient/family Time For Goal Achievement: 01/03/21 Potential to Achieve Goals: Good ADL Goals Pt Will Perform Lower Body Dressing: with supervision;sit  to/from stand (AE PRN, RW to stand) Pt Will Transfer to Toilet: with supervision;ambulating (elevated commode, LRAD for amb) Additional ADL Goal #1: Pt will utilize RW to negotiate environment with supervision and PRN VC for safety/sequencing.  OT Frequency: Min 2X/week   Barriers to D/C: Decreased caregiver support;Inaccessible home environment          Co-evaluation              AM-PAC OT "6 Clicks" Daily Activity      Outcome Measure Help from another person eating meals?: None Help from another person taking care of personal grooming?: None Help from another person toileting, which includes using toliet, bedpan, or urinal?: A Lot Help from another person bathing (including washing, rinsing, drying)?: A Lot Help from another person to put on and taking off regular upper body clothing?: None Help from another person to put on and taking off regular lower body clothing?: A Lot 6 Click Score: 18   End of Session Equipment Utilized During Treatment: Gait belt;Rolling walker (2 wheels)  Activity Tolerance: Patient tolerated treatment well Patient left: in chair;with call bell/phone within reach;with nursing/sitter in room;with family/visitor present;Other (comment) (rolled towel under R ankle, polar care in place)  OT Visit Diagnosis: Muscle weakness (generalized) (M62.81);Pain                Time: 1522-1540 OT Time Calculation (min): 18 min Charges:  OT General Charges $OT Visit: 1 Visit OT Evaluation $OT Eval Moderate Complexity: 1 Mod OT Treatments $Self Care/Home Management : 8-22 mins  Ardeth Perfect., MPH, MS, OTR/L ascom [phone] 12/20/20, 4:02 PM

## 2020-12-20 NOTE — ED Notes (Addendum)
Patient upset stating that staff "has done nothing for me" and wanting to leave. Patient found putting on clothes trying to leave. Patient appears confused compared to earlier this morning. Siadecki, MD to bedside to speak with patient. Patient yelling at MD and staff. Patient helped into wheelchair. [name], family friend, called to come speak with patient and MD. Raquel Sarna, NT at bedside sitting with patient.

## 2020-12-20 NOTE — ED Notes (Signed)
Patient repositioned in bed. No need expressed at this time.

## 2020-12-20 NOTE — ED Notes (Signed)
This tech assisted pt to the toilet. Pt is sitting in the recliner at this moment. Hospital bed placed in room. Ice machine is currently being use by pt.

## 2020-12-20 NOTE — ED Notes (Signed)
Patient resting comfortably in recliner with family at bedside. NAD noted.

## 2020-12-20 NOTE — ED Notes (Signed)
Patient resting in recliner. Hospital bed placed in room. Family friend, [name] Insurance risk surveyor in room with patient.

## 2020-12-20 NOTE — ED Notes (Signed)
SW at bedside with patient and family friend, [name].

## 2020-12-20 NOTE — ED Notes (Addendum)
Patient's bed found wet. Patient cleaned. New purewick placed at this time and new chuck pads. Warm blankets given and bed repositioned. Call light within reach.

## 2020-12-20 NOTE — Evaluation (Signed)
Physical Therapy Evaluation Patient Details Name: Karen Harvey MRN: [id number] DOB: 02/14/1940 Today's Date: 12/20/2020  History of Present Illness  Pt is an 80 y.o. 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 Pt came to the ED on 12/20/20 with complaints of knee pain and unable to walk on her R knee. Pt states that she lost her balance and fell backwards bumping the wall.  Clinical Impression  Pt eager to work with PT but showed general difficulty following even simple cuing and though in theory she should be familiar with the basic exercises performed today she needed extra cuing and reinforcement to perform.  Pt lacks full knee extension (lacks ~4*) and had flexion to nearly 90.  She struggled to consistently accept weight on the R with knee either being too bent or being unable to maintain consistent positioning inside the walker. Pt did not have any overt LOBs but showed general instability t/o the effort.  Pt showed poor insight, impulsivity (attempted to get to standing before walker was even close) and struggled to consistently engage quad during even very basic exercises.  Pt is not safe to manage safely at home alone, she does eventually agree that she needs to go to rehab for a while before she is safe to do so.       Recommendations for follow up therapy are one component of a multi-disciplinary discharge planning process, led by the attending physician.  Recommendations may be updated based on patient status, additional functional criteria and insurance authorization.  Follow Up Recommendations Skilled nursing-short term rehab (<3 hours/day)    Assistance Recommended at Discharge Set up Supervision/Assistance  Functional Status Assessment Patient has had a recent decline in their functional status and demonstrates the ability to make significant improvements in function in a reasonable and predictable amount of time.  Equipment  Recommendations  None recommended by PT    Recommendations for Other Services       Precautions / Restrictions Precautions Precautions: Knee;Fall Precaution Comments: wound vac Restrictions Weight Bearing Restrictions: Yes RLE Weight Bearing: Weight bearing as tolerated      Mobility  Bed Mobility               General bed mobility comments: NT,  up in recliner at start and end of session    Transfers Overall transfer level: Needs assistance Equipment used: Rolling walker (2 wheels) Transfers: Sit to/from Stand Sit to Stand: Min assist           General transfer comment: VC for safety and sequencing.  She started to stand up before walker was available, needed repeat reminders for hand/foot placement, and general safety    Ambulation/Gait Ambulation/Gait assistance: Min guard Gait Distance (Feet): 60 Feet Assistive device: Rolling walker (2 wheels)         General Gait Details: Pt unable to attain full knee extension with weight acceptance and needed hands on cuing at times to insure knee did not over bend -  no overt buckling but unable to make efficient adjustments to correct despite much cuing.  Vitals remained stable with the effort though she c/o increasing pain and fatigue with this modest effort  Stairs            Wheelchair Mobility    Modified Rankin (Stroke Patients Only)       Balance Overall balance assessment: Needs assistance Sitting-balance support: No upper extremity supported;Feet supported Sitting balance-Leahy Scale: Good  Standing balance support: Bilateral upper extremity supported Standing balance-Leahy Scale: Fair Standing balance comment: no overt LOBs during static and dynamic standing acts, but general unsteadiness and inability to make adjustments despite repeat cues                             Pertinent Vitals/Pain Pain Assessment: 0-10 (minimal pain at rest) Faces Pain Scale: Hurts even more Pain  Location: R knee Pain Descriptors / Indicators: Aching;Sore Pain Intervention(s): Limited activity within patient's tolerance;Monitored during session;Premedicated before session;Repositioned;Ice applied    Home Living Family/patient expects to be discharged to:: Skilled nursing facility Living Arrangements: Alone Available Help at Discharge: Family;Available PRN/intermittently Type of Home: House Home Access: Stairs to enter Entrance Stairs-Rails: Left Entrance Stairs-Number of Steps: 3-4 Alternate Level Stairs-Number of Steps: 1 step down to bathroom (no railing but has wall for support) Home Layout: Two level;Able to live on main level with bedroom/bathroom Home Equipment: Rolling Walker (2 wheels);BSC/3in1;Cane - quad      Prior Function Prior Level of Function : Independent/Modified Independent             Mobility Comments: No recent falls ADLs Comments: Pt reports she was independent with self care, IADLs, including yardwork and driving prior to admission     Hand Dominance   Dominant Hand: Right    Extremity/Trunk Assessment   Upper Extremity Assessment Upper Extremity Assessment: Defer to OT evaluation    Lower Extremity Assessment Lower Extremity Assessment: Generalized weakness RLE Deficits / Details: s/p recent TKA with expected impairments in strength and ROM, lacks terminal knee extension (4*)       Communication   Communication: No difficulties  Cognition Arousal/Alertness: Awake/alert Behavior During Therapy: WFL for tasks assessed/performed Overall Cognitive Status: Within Functional Limits for tasks assessed (had difficulty organizing/coordinating quad sets and other exercises that, in theory, should be very familiar 4 days post TKA)                                 General Comments: `        General Comments      Exercises Total Joint Exercises Ankle Circles/Pumps: AROM;10 reps Quad Sets: Strengthening;10 reps (pt struggled  to consistently engage quad) Heel Slides: AROM;10 reps Hip ABduction/ADduction: AROM;10 reps Straight Leg Raises: AAROM;5 reps Long Arc Quad: AROM;5 reps Knee Flexion: PROM;5 reps Goniometric ROM: 4-89 Other Exercises Other Exercises: pt/family instructed in home/routines modifications, falls prevention, toilet transfers, RW mgt, and need for additional therapy and assist initially to maximize safety   Assessment/Plan    PT Assessment Patient needs continued PT services  PT Problem List Decreased strength;Decreased range of motion;Decreased activity tolerance;Decreased balance;Decreased mobility;Decreased knowledge of use of DME;Decreased knowledge of precautions;Pain;Decreased skin integrity       PT Treatment Interventions DME instruction;Gait training;Stair training;Functional mobility training;Therapeutic activities;Therapeutic exercise;Balance training;Patient/family education    PT Goals (Current goals can be found in the Care Plan section)  Acute Rehab PT Goals Patient Stated Goal: get better at rehab and back home soon PT Goal Formulation: With patient Time For Goal Achievement: 01/03/21 Potential to Achieve Goals: Fair    Frequency BID   Barriers to discharge Decreased caregiver support      Co-evaluation               AM-PAC PT "6 Clicks" Mobility  Outcome Measure Help needed turning from your back  to your side while in a flat bed without using bedrails?: None Help needed moving from lying on your back to sitting on the side of a flat bed without using bedrails?: A Little Help needed moving to and from a bed to a chair (including a wheelchair)?: A Little Help needed standing up from a chair using your arms (e.g., wheelchair or bedside chair)?: A Little Help needed to walk in hospital room?: A Little Help needed climbing 3-5 steps with a railing? : Total 6 Click Score: 17    End of Session Equipment Utilized During Treatment: Gait belt Activity Tolerance:  Patient limited by pain Patient left: in chair;with call bell/phone within reach;with nursing/sitter in room;with family/visitor present Nurse Communication: Mobility status;Precautions;Weight bearing status PT Visit Diagnosis: Other abnormalities of gait and mobility (R26.89);Muscle weakness (generalized) (M62.81);Difficulty in walking, not elsewhere classified (R26.2);Pain Pain - Right/Left: Right Pain - part of body: Knee    Time: 8676-7209 PT Time Calculation (min) (ACUTE ONLY): 38 min   Charges:   PT Evaluation $PT Eval Low Complexity: 1 Low PT Treatments $Therapeutic Exercise: 8-22 mins $Therapeutic Activity: 8-22 mins        Kreg Shropshire, DPT 12/20/2020, 5:13 PM

## 2020-12-21 LAB — RESP PANEL BY RT-PCR (FLU A&B, COVID) ARPGX2
Influenza A by PCR: NEGATIVE
Influenza B by PCR: NEGATIVE
SARS Coronavirus 2 by RT PCR: NEGATIVE

## 2020-12-21 NOTE — ED Notes (Signed)
Patient resting in bed with eyes closed. Resp even unlabored on RA. No distress noted at this time.

## 2020-12-21 NOTE — ED Notes (Signed)
Pt given fresh underwear and paper scrub pants, cleaned up, assisted back to recliner. Fresh ice placed in iceman, pt given fresh warm blanket, and assisted with getting comfortable. Call bell and side table within reach. Pt ate all breakfast at this time.

## 2020-12-21 NOTE — ED Notes (Signed)
Lovenox due at this time, this pt requesting all medication be given together closer to 10. This RN will administer lovenox with 1000 medications due.

## 2020-12-21 NOTE — Progress Notes (Signed)
Physical Therapy Treatment Patient Details Name: AIYANNAH FAYAD MRN: [id number] DOB: Apr 19, 1940 Today's Date: 12/21/2020   History of Present Illness Pt is an 80 y.o. 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 Pt came to the ED on 12/20/20 with complaints of knee pain and unable to walk on her R knee. Pt states that she lost her balance and fell backwards bumping the wall.    PT Comments    Pt pleasant t/o session and willing to participate but continues to show some issues with safety and general lack of awareness about post surgery expectations.  She showed good effort, still needing some reminders of her limitations and need for STR, but again understands her current status and inability to manage at home alone.    Recommendations for follow up therapy are one component of a multi-disciplinary discharge planning process, led by the attending physician.  Recommendations may be updated based on patient status, additional functional criteria and insurance authorization.  Follow Up Recommendations  Skilled nursing-short term rehab (<3 hours/day)     Assistance Recommended at Discharge Set up [place] Recommendations  None recommended by PT    Recommendations for Other Services       Precautions / Restrictions Precautions Precautions: Knee;Fall Restrictions RLE Weight Bearing: Weight bearing as tolerated     Mobility  Bed Mobility               General bed mobility comments: NT,  up in recliner at start and end of session    Transfers Overall transfer level: Needs assistance Equipment used: Rolling walker (2 wheels) Transfers: Sit to/from Stand Sit to Stand: Min guard           General transfer comment: minimal cuing for hand placement but able to rise w/o direct assist    Ambulation/Gait Ambulation/Gait assistance: Min guard Gait Distance (Feet): 75 Feet Assistive device: Rolling walker (2  wheels)         General Gait Details: Pt still lacking TKE during weight acceptance but showed better ability today and increased speed and confidence though overall still lacking good safely awareness and consistency   Stairs             Wheelchair Mobility    Modified Rankin (Stroke Patients Only)       Balance Overall balance assessment: Needs assistance Sitting-balance support: No upper extremity supported;Feet supported Sitting balance-Leahy Scale: Good     Standing balance support: Bilateral upper extremity supported Standing balance-Leahy Scale: Fair Standing balance comment: no overt LOBs during static and dynamic standing acts, but general unsteadiness and inability to make adjustments despite repeat cues                            Cognition Arousal/Alertness: Awake/alert Behavior During Therapy: WFL for tasks assessed/performed Overall Cognitive Status: Within Functional Limits for tasks assessed                                 General Comments: `        Exercises Total Joint Exercises Quad Sets: Strengthening;10 reps Short Arc Quad: AROM;10 reps Heel Slides: AROM;10 reps Hip ABduction/ADduction: AROM;10 reps Straight Leg Raises: AAROM;5 reps Long Arc Quad: AROM;5 reps Knee Flexion: PROM;5 reps Goniometric ROM: 3-86    General Comments        Pertinent Vitals/Pain  Faces Pain Scale: Hurts little more Pain Location: R knee    Home Living                          Prior Function            PT Goals (current goals can now be found in the care plan section) Progress towards PT goals: Progressing toward goals    Frequency    7X/week      PT Plan Frequency needs to be updated    Co-evaluation              AM-PAC PT "6 Clicks" Mobility   Outcome Measure  Help needed turning from your back to your side while in a flat bed without using bedrails?: None Help needed moving from lying on your  back to sitting on the side of a flat bed without using bedrails?: A Little Help needed moving to and from a bed to a chair (including a wheelchair)?: A Little Help needed standing up from a chair using your arms (e.g., wheelchair or bedside chair)?: A Little Help needed to walk in hospital room?: A Little Help needed climbing 3-5 steps with a railing? : Total 6 Click Score: 17    End of Session Equipment Utilized During Treatment: Gait belt Activity Tolerance: Patient limited by pain Patient left: in chair;with call bell/phone within reach;with nursing/sitter in room;with family/visitor present Nurse Communication: Mobility status;Precautions;Weight bearing status PT Visit Diagnosis: Other abnormalities of gait and mobility (R26.89);Muscle weakness (generalized) (M62.81);Difficulty in walking, not elsewhere classified (R26.2);Pain Pain - Right/Left: Right Pain - part of body: Knee     Time: 1036-1100 PT Time Calculation (min) (ACUTE ONLY): 24 min  Charges:  $Gait Training: 8-22 mins $Therapeutic Exercise: 8-22 mins                     Kreg Shropshire, DPT 12/21/2020, 1:31 PM

## 2020-12-21 NOTE — ED Notes (Signed)
Patient sitting upright in chair. Resp even, unlabored on RA. AOX4. Assisted up to [facility]. Continent of urine. Assisted back to chair. Denies needs at this time.

## 2020-12-21 NOTE — TOC Progression Note (Signed)
Transition of Care [facility]) - Progression Note    Patient Details  Name: Karen Harvey MRN: [id number] Date of Birth: June 19, 1940  Transition of Care [facility]) CM/SW Contact  Shelbie Hutching, RN Phone Number: 12/21/2020, 4:23 PM  Clinical Narrative:    No word from [facility] on insurance authorization.  Authorization should be back tomorrow.  Patient's friend [name] updated on plan for discharge tomorrow.    Expected Discharge Plan: Skilled Nursing Facility Barriers to Discharge: Insurance Authorization  Expected Discharge Plan and Services Expected Discharge Plan: [place]   Discharge Planning Services: CM Consult Post Acute Care Choice: [place] Living arrangements for the past 2 months: Single Family Home                 DME Arranged: N/A DME Agency: NA       HH Arranged: NA HH Agency: NA         Social Determinants of Health (SDOH) Interventions    Readmission Risk Interventions No flowsheet data found.

## 2020-12-21 NOTE — ED Notes (Signed)
Patient assisted up to [facility]. Continent of urine. Assisted back to recliner and positioned for comfort. Denies needs at this time.

## 2020-12-21 NOTE — TOC Progression Note (Signed)
Transition of Care [facility]) - Progression Note    Patient Details  Name: RAYME BUI MRN: [id number] Date of Birth: [birth year]  Transition of Care [facility]) CM/SW Contact  Shelbie Hutching, RN Phone Number: 12/21/2020, 11:42 AM  Clinical Narrative:    [facility] offered a bed, bed offer accepted.  [facility] has started insurance authorization.     Expected Discharge Plan: Skilled Nursing Facility Barriers to Discharge: Insurance Authorization  Expected Discharge Plan and Services Expected Discharge Plan: [place]   Discharge Planning Services: CM Consult Post Acute Care Choice: [place] Living arrangements for the past 2 months: Single Family Home                 DME Arranged: N/A DME Agency: NA       HH Arranged: NA HH Agency: NA         Social Determinants of Health (SDOH) Interventions    Readmission Risk Interventions No flowsheet data found.

## 2020-12-21 NOTE — ED Notes (Signed)
Pt assisted to [facility] at this time. Pt given fresh briefs, chuck pads changed on bed and chair, linen changed on bed and chair. Pt on BSC attempting to have bowel movement, call bell within reach.

## 2020-12-22 DIAGNOSIS — E559 Vitamin D deficiency, unspecified: Secondary | ICD-10-CM | POA: Diagnosis not present

## 2020-12-22 DIAGNOSIS — M6281 Muscle weakness (generalized): Secondary | ICD-10-CM | POA: Diagnosis not present

## 2020-12-22 DIAGNOSIS — R404 Transient alteration of awareness: Secondary | ICD-10-CM | POA: Diagnosis not present

## 2020-12-22 DIAGNOSIS — I959 Hypotension, unspecified: Secondary | ICD-10-CM | POA: Diagnosis not present

## 2020-12-22 DIAGNOSIS — Z96651 Presence of right artificial knee joint: Secondary | ICD-10-CM | POA: Diagnosis not present

## 2020-12-22 DIAGNOSIS — F32A Depression, unspecified: Secondary | ICD-10-CM | POA: Diagnosis not present

## 2020-12-22 DIAGNOSIS — R011 Cardiac murmur, unspecified: Secondary | ICD-10-CM | POA: Diagnosis not present

## 2020-12-22 DIAGNOSIS — R509 Fever, unspecified: Secondary | ICD-10-CM | POA: Diagnosis not present

## 2020-12-22 DIAGNOSIS — E569 Vitamin deficiency, unspecified: Secondary | ICD-10-CM | POA: Diagnosis not present

## 2020-12-22 DIAGNOSIS — Z79899 Other long term (current) drug therapy: Secondary | ICD-10-CM | POA: Diagnosis not present

## 2020-12-22 DIAGNOSIS — I10 Essential (primary) hypertension: Secondary | ICD-10-CM | POA: Diagnosis not present

## 2020-12-22 DIAGNOSIS — M1711 Unilateral primary osteoarthritis, right knee: Secondary | ICD-10-CM | POA: Diagnosis not present

## 2020-12-22 DIAGNOSIS — Z743 Need for continuous supervision: Secondary | ICD-10-CM | POA: Diagnosis not present

## 2020-12-22 DIAGNOSIS — Z20822 Contact with and (suspected) exposure to covid-19: Secondary | ICD-10-CM | POA: Diagnosis not present

## 2020-12-22 DIAGNOSIS — E785 Hyperlipidemia, unspecified: Secondary | ICD-10-CM | POA: Diagnosis not present

## 2020-12-22 DIAGNOSIS — K5900 Constipation, unspecified: Secondary | ICD-10-CM | POA: Diagnosis not present

## 2020-12-22 NOTE — ED Notes (Signed)
Called [facility] for transport to [place]

## 2020-12-22 NOTE — ED Notes (Signed)
Pt back to recliner from [facility]. Pt updated on status of returning to [facility]. No additional needs verbalized at this time. Recliner wheels locked; call light & personal items within reach.

## 2020-12-22 NOTE — ED Notes (Signed)
Pt on the phone with her niece, pt yelling at staff about not getting any food since she's been here, no breakfast this am, pt has a pizza tray left over from last pm that Is thrown away due to being old. Pt is asking her niece to come get her out of here that she's being treated like a dog.

## 2020-12-23 DIAGNOSIS — I10 Essential (primary) hypertension: Secondary | ICD-10-CM | POA: Diagnosis not present

## 2020-12-23 DIAGNOSIS — K5900 Constipation, unspecified: Secondary | ICD-10-CM | POA: Diagnosis not present

## 2020-12-23 DIAGNOSIS — E785 Hyperlipidemia, unspecified: Secondary | ICD-10-CM | POA: Diagnosis not present

## 2020-12-23 DIAGNOSIS — M1711 Unilateral primary osteoarthritis, right knee: Secondary | ICD-10-CM | POA: Diagnosis not present

## 2020-12-23 DIAGNOSIS — F32A Depression, unspecified: Secondary | ICD-10-CM | POA: Diagnosis not present

## 2020-12-23 DIAGNOSIS — M6281 Muscle weakness (generalized): Secondary | ICD-10-CM | POA: Diagnosis not present

## 2020-12-26 DIAGNOSIS — K5900 Constipation, unspecified: Secondary | ICD-10-CM | POA: Diagnosis not present

## 2020-12-26 DIAGNOSIS — M1711 Unilateral primary osteoarthritis, right knee: Secondary | ICD-10-CM | POA: Diagnosis not present

## 2020-12-26 DIAGNOSIS — I10 Essential (primary) hypertension: Secondary | ICD-10-CM | POA: Diagnosis not present

## 2020-12-28 DIAGNOSIS — K5900 Constipation, unspecified: Secondary | ICD-10-CM | POA: Diagnosis not present

## 2020-12-28 DIAGNOSIS — M1711 Unilateral primary osteoarthritis, right knee: Secondary | ICD-10-CM | POA: Diagnosis not present

## 2021-01-02 DIAGNOSIS — Z20822 Contact with and (suspected) exposure to covid-19: Secondary | ICD-10-CM | POA: Diagnosis not present

## 2021-01-04 DIAGNOSIS — M1711 Unilateral primary osteoarthritis, right knee: Secondary | ICD-10-CM | POA: Diagnosis not present

## 2021-01-04 DIAGNOSIS — Z20822 Contact with and (suspected) exposure to covid-19: Secondary | ICD-10-CM | POA: Diagnosis not present

## 2021-01-10 DIAGNOSIS — M6281 Muscle weakness (generalized): Secondary | ICD-10-CM | POA: Diagnosis not present

## 2021-01-10 DIAGNOSIS — M1711 Unilateral primary osteoarthritis, right knee: Secondary | ICD-10-CM | POA: Diagnosis not present

## 2021-01-10 DIAGNOSIS — I10 Essential (primary) hypertension: Secondary | ICD-10-CM | POA: Diagnosis not present

## 2021-01-10 DIAGNOSIS — K5900 Constipation, unspecified: Secondary | ICD-10-CM | POA: Diagnosis not present

## 2021-01-13 DIAGNOSIS — Z96651 Presence of right artificial knee joint: Secondary | ICD-10-CM | POA: Diagnosis not present

## 2021-01-13 DIAGNOSIS — M25661 Stiffness of right knee, not elsewhere classified: Secondary | ICD-10-CM | POA: Diagnosis not present

## 2021-01-13 DIAGNOSIS — M25561 Pain in right knee: Secondary | ICD-10-CM | POA: Diagnosis not present

## 2021-01-13 DIAGNOSIS — R29898 Other symptoms and signs involving the musculoskeletal system: Secondary | ICD-10-CM | POA: Diagnosis not present

## 2021-01-16 DIAGNOSIS — Z96651 Presence of right artificial knee joint: Secondary | ICD-10-CM | POA: Diagnosis not present

## 2021-01-16 DIAGNOSIS — M25561 Pain in right knee: Secondary | ICD-10-CM | POA: Diagnosis not present

## 2021-01-18 DIAGNOSIS — Z96651 Presence of right artificial knee joint: Secondary | ICD-10-CM | POA: Diagnosis not present

## 2021-01-18 DIAGNOSIS — M25561 Pain in right knee: Secondary | ICD-10-CM | POA: Diagnosis not present

## 2021-01-20 DIAGNOSIS — Z96651 Presence of right artificial knee joint: Secondary | ICD-10-CM | POA: Diagnosis not present

## 2021-01-23 DIAGNOSIS — M25561 Pain in right knee: Secondary | ICD-10-CM | POA: Diagnosis not present

## 2021-01-23 DIAGNOSIS — Z96651 Presence of right artificial knee joint: Secondary | ICD-10-CM | POA: Diagnosis not present

## 2021-01-25 DIAGNOSIS — Z96651 Presence of right artificial knee joint: Secondary | ICD-10-CM | POA: Diagnosis not present

## 2021-01-27 DIAGNOSIS — Z96651 Presence of right artificial knee joint: Secondary | ICD-10-CM | POA: Diagnosis not present

## 2021-01-27 DIAGNOSIS — M25561 Pain in right knee: Secondary | ICD-10-CM | POA: Diagnosis not present

## 2021-01-30 DIAGNOSIS — Z96651 Presence of right artificial knee joint: Secondary | ICD-10-CM | POA: Diagnosis not present

## 2021-01-30 DIAGNOSIS — M25561 Pain in right knee: Secondary | ICD-10-CM | POA: Diagnosis not present

## 2021-02-01 DIAGNOSIS — Z96651 Presence of right artificial knee joint: Secondary | ICD-10-CM | POA: Diagnosis not present

## 2021-02-01 DIAGNOSIS — M25561 Pain in right knee: Secondary | ICD-10-CM | POA: Diagnosis not present

## 2021-02-03 DIAGNOSIS — M25561 Pain in right knee: Secondary | ICD-10-CM | POA: Diagnosis not present

## 2021-03-09 ENCOUNTER — Ambulatory Visit: Payer: Medicare HMO | Admitting: Internal Medicine

## 2021-03-15 DIAGNOSIS — E7800 Pure hypercholesterolemia, unspecified: Secondary | ICD-10-CM | POA: Diagnosis not present

## 2021-03-15 DIAGNOSIS — R8290 Unspecified abnormal findings in urine: Secondary | ICD-10-CM | POA: Diagnosis not present

## 2021-03-15 DIAGNOSIS — F5104 Psychophysiologic insomnia: Secondary | ICD-10-CM | POA: Diagnosis not present

## 2021-03-15 DIAGNOSIS — M170 Bilateral primary osteoarthritis of knee: Secondary | ICD-10-CM | POA: Diagnosis not present

## 2021-03-15 DIAGNOSIS — I10 Essential (primary) hypertension: Secondary | ICD-10-CM | POA: Diagnosis not present

## 2021-03-22 DIAGNOSIS — E782 Mixed hyperlipidemia: Secondary | ICD-10-CM | POA: Diagnosis not present

## 2021-03-22 DIAGNOSIS — Z96651 Presence of right artificial knee joint: Secondary | ICD-10-CM | POA: Diagnosis not present

## 2021-03-22 DIAGNOSIS — K219 Gastro-esophageal reflux disease without esophagitis: Secondary | ICD-10-CM | POA: Diagnosis not present

## 2021-03-22 DIAGNOSIS — I10 Essential (primary) hypertension: Secondary | ICD-10-CM | POA: Diagnosis not present

## 2021-03-22 DIAGNOSIS — Z0000 Encounter for general adult medical examination without abnormal findings: Secondary | ICD-10-CM | POA: Diagnosis not present

## 2021-03-22 DIAGNOSIS — Z1389 Encounter for screening for other disorder: Secondary | ICD-10-CM | POA: Diagnosis not present

## 2021-08-17 ENCOUNTER — Other Ambulatory Visit
Admission: RE | Admit: 2021-08-17 | Discharge: 2021-08-17 | Disposition: A | Discharge status: Home / Self Care | Payer: Medicare HMO | Source: Ambulatory Visit | Attending: Student | Admitting: Student

## 2021-08-17 DIAGNOSIS — M2550 Pain in unspecified joint: Secondary | ICD-10-CM | POA: Diagnosis not present

## 2021-08-17 DIAGNOSIS — R600 Localized edema: Secondary | ICD-10-CM | POA: Diagnosis not present

## 2021-08-17 DIAGNOSIS — R7989 Other specified abnormal findings of blood chemistry: Secondary | ICD-10-CM | POA: Diagnosis not present

## 2021-08-17 DIAGNOSIS — L729 Follicular cyst of the skin and subcutaneous tissue, unspecified: Secondary | ICD-10-CM | POA: Diagnosis not present

## 2021-08-17 DIAGNOSIS — I10 Essential (primary) hypertension: Secondary | ICD-10-CM | POA: Diagnosis not present

## 2021-08-17 DIAGNOSIS — L089 Local infection of the skin and subcutaneous tissue, unspecified: Secondary | ICD-10-CM | POA: Diagnosis not present

## 2021-08-17 DIAGNOSIS — R202 Paresthesia of skin: Secondary | ICD-10-CM | POA: Insufficient documentation

## 2021-08-17 LAB — BRAIN NATRIURETIC PEPTIDE: B Natriuretic Peptide: 135.4 pg/mL — ABNORMAL HIGH (ref 0.0–100.0)

## 2021-08-28 DIAGNOSIS — I10 Essential (primary) hypertension: Secondary | ICD-10-CM | POA: Diagnosis not present

## 2021-08-28 DIAGNOSIS — R609 Edema, unspecified: Secondary | ICD-10-CM | POA: Diagnosis not present

## 2021-08-28 DIAGNOSIS — R9431 Abnormal electrocardiogram [ECG] [EKG]: Secondary | ICD-10-CM | POA: Diagnosis not present

## 2021-08-28 DIAGNOSIS — I517 Cardiomegaly: Secondary | ICD-10-CM | POA: Diagnosis not present

## 2021-09-08 DIAGNOSIS — I10 Essential (primary) hypertension: Secondary | ICD-10-CM | POA: Diagnosis not present

## 2021-09-08 DIAGNOSIS — R9431 Abnormal electrocardiogram [ECG] [EKG]: Secondary | ICD-10-CM | POA: Diagnosis not present

## 2021-09-08 DIAGNOSIS — R609 Edema, unspecified: Secondary | ICD-10-CM | POA: Diagnosis not present

## 2021-09-19 DIAGNOSIS — E782 Mixed hyperlipidemia: Secondary | ICD-10-CM | POA: Diagnosis not present

## 2021-09-19 DIAGNOSIS — R7309 Other abnormal glucose: Secondary | ICD-10-CM | POA: Diagnosis not present

## 2021-09-19 DIAGNOSIS — Z96651 Presence of right artificial knee joint: Secondary | ICD-10-CM | POA: Diagnosis not present

## 2021-09-19 DIAGNOSIS — M25561 Pain in right knee: Secondary | ICD-10-CM | POA: Diagnosis not present

## 2021-09-19 DIAGNOSIS — I10 Essential (primary) hypertension: Secondary | ICD-10-CM | POA: Diagnosis not present

## 2021-09-19 DIAGNOSIS — M159 Polyosteoarthritis, unspecified: Secondary | ICD-10-CM | POA: Diagnosis not present

## 2021-09-19 DIAGNOSIS — K219 Gastro-esophageal reflux disease without esophagitis: Secondary | ICD-10-CM | POA: Diagnosis not present

## 2021-09-25 DIAGNOSIS — I517 Cardiomegaly: Secondary | ICD-10-CM | POA: Diagnosis not present

## 2021-09-25 DIAGNOSIS — I10 Essential (primary) hypertension: Secondary | ICD-10-CM | POA: Diagnosis not present

## 2021-09-25 DIAGNOSIS — E782 Mixed hyperlipidemia: Secondary | ICD-10-CM | POA: Diagnosis not present

## 2021-09-27 DIAGNOSIS — Z6830 Body mass index (BMI) 30.0-30.9, adult: Secondary | ICD-10-CM | POA: Diagnosis not present

## 2021-09-27 DIAGNOSIS — Z23 Encounter for immunization: Secondary | ICD-10-CM | POA: Diagnosis not present

## 2021-09-27 DIAGNOSIS — Z0000 Encounter for general adult medical examination without abnormal findings: Secondary | ICD-10-CM | POA: Diagnosis not present

## 2021-09-27 DIAGNOSIS — M159 Polyosteoarthritis, unspecified: Secondary | ICD-10-CM | POA: Diagnosis not present

## 2021-09-27 DIAGNOSIS — E785 Hyperlipidemia, unspecified: Secondary | ICD-10-CM | POA: Diagnosis not present

## 2021-09-27 DIAGNOSIS — K219 Gastro-esophageal reflux disease without esophagitis: Secondary | ICD-10-CM | POA: Diagnosis not present

## 2021-09-27 DIAGNOSIS — I10 Essential (primary) hypertension: Secondary | ICD-10-CM | POA: Diagnosis not present

## 2021-10-09 DIAGNOSIS — H2512 Age-related nuclear cataract, left eye: Secondary | ICD-10-CM | POA: Diagnosis not present

## 2021-11-03 DIAGNOSIS — S6992XA Unspecified injury of left wrist, hand and finger(s), initial encounter: Secondary | ICD-10-CM | POA: Diagnosis not present

## 2021-11-27 DIAGNOSIS — M5451 Vertebrogenic low back pain: Secondary | ICD-10-CM | POA: Diagnosis not present

## 2021-11-27 DIAGNOSIS — M9903 Segmental and somatic dysfunction of lumbar region: Secondary | ICD-10-CM | POA: Diagnosis not present

## 2022-01-20 DIAGNOSIS — Z03818 Encounter for observation for suspected exposure to other biological agents ruled out: Secondary | ICD-10-CM | POA: Diagnosis not present

## 2022-01-20 DIAGNOSIS — J019 Acute sinusitis, unspecified: Secondary | ICD-10-CM | POA: Diagnosis not present

## 2022-03-13 DIAGNOSIS — I10 Essential (primary) hypertension: Secondary | ICD-10-CM | POA: Diagnosis not present

## 2022-03-13 DIAGNOSIS — E7800 Pure hypercholesterolemia, unspecified: Secondary | ICD-10-CM | POA: Diagnosis not present

## 2022-03-13 DIAGNOSIS — R8290 Unspecified abnormal findings in urine: Secondary | ICD-10-CM | POA: Diagnosis not present

## 2022-03-13 DIAGNOSIS — K219 Gastro-esophageal reflux disease without esophagitis: Secondary | ICD-10-CM | POA: Diagnosis not present

## 2022-03-13 DIAGNOSIS — Z6830 Body mass index (BMI) 30.0-30.9, adult: Secondary | ICD-10-CM | POA: Diagnosis not present

## 2022-03-13 DIAGNOSIS — R7309 Other abnormal glucose: Secondary | ICD-10-CM | POA: Diagnosis not present

## 2022-03-13 DIAGNOSIS — M159 Polyosteoarthritis, unspecified: Secondary | ICD-10-CM | POA: Diagnosis not present

## 2022-03-20 DIAGNOSIS — M1712 Unilateral primary osteoarthritis, left knee: Secondary | ICD-10-CM | POA: Diagnosis not present

## 2022-03-20 DIAGNOSIS — Z0000 Encounter for general adult medical examination without abnormal findings: Secondary | ICD-10-CM | POA: Diagnosis not present

## 2022-03-20 DIAGNOSIS — Z1331 Encounter for screening for depression: Secondary | ICD-10-CM | POA: Diagnosis not present

## 2022-03-20 DIAGNOSIS — F32A Depression, unspecified: Secondary | ICD-10-CM | POA: Diagnosis not present

## 2022-03-20 DIAGNOSIS — E785 Hyperlipidemia, unspecified: Secondary | ICD-10-CM | POA: Diagnosis not present

## 2022-03-20 DIAGNOSIS — K219 Gastro-esophageal reflux disease without esophagitis: Secondary | ICD-10-CM | POA: Diagnosis not present

## 2022-09-11 DIAGNOSIS — R7309 Other abnormal glucose: Secondary | ICD-10-CM | POA: Diagnosis not present

## 2022-09-11 DIAGNOSIS — Z6830 Body mass index (BMI) 30.0-30.9, adult: Secondary | ICD-10-CM | POA: Diagnosis not present

## 2022-09-11 DIAGNOSIS — E782 Mixed hyperlipidemia: Secondary | ICD-10-CM | POA: Diagnosis not present

## 2022-09-11 DIAGNOSIS — M1712 Unilateral primary osteoarthritis, left knee: Secondary | ICD-10-CM | POA: Diagnosis not present

## 2022-09-11 DIAGNOSIS — I10 Essential (primary) hypertension: Secondary | ICD-10-CM | POA: Diagnosis not present

## 2022-09-11 DIAGNOSIS — K219 Gastro-esophageal reflux disease without esophagitis: Secondary | ICD-10-CM | POA: Diagnosis not present

## 2022-09-18 DIAGNOSIS — E785 Hyperlipidemia, unspecified: Secondary | ICD-10-CM | POA: Diagnosis not present

## 2022-09-18 DIAGNOSIS — Z0000 Encounter for general adult medical examination without abnormal findings: Secondary | ICD-10-CM | POA: Diagnosis not present

## 2022-09-18 DIAGNOSIS — M1712 Unilateral primary osteoarthritis, left knee: Secondary | ICD-10-CM | POA: Diagnosis not present

## 2022-09-18 DIAGNOSIS — R21 Rash and other nonspecific skin eruption: Secondary | ICD-10-CM | POA: Diagnosis not present

## 2022-10-15 DIAGNOSIS — H40003 Preglaucoma, unspecified, bilateral: Secondary | ICD-10-CM | POA: Diagnosis not present

## 2022-10-15 DIAGNOSIS — H2512 Age-related nuclear cataract, left eye: Secondary | ICD-10-CM | POA: Diagnosis not present

## 2022-10-15 DIAGNOSIS — H43813 Vitreous degeneration, bilateral: Secondary | ICD-10-CM | POA: Diagnosis not present

## 2022-12-11 DIAGNOSIS — R140 Abdominal distension (gaseous): Secondary | ICD-10-CM | POA: Diagnosis not present

## 2022-12-11 DIAGNOSIS — R109 Unspecified abdominal pain: Secondary | ICD-10-CM | POA: Diagnosis not present

## 2022-12-11 DIAGNOSIS — R142 Eructation: Secondary | ICD-10-CM | POA: Diagnosis not present

## 2022-12-11 DIAGNOSIS — R1084 Generalized abdominal pain: Secondary | ICD-10-CM | POA: Diagnosis not present

## 2022-12-11 DIAGNOSIS — I517 Cardiomegaly: Secondary | ICD-10-CM | POA: Diagnosis not present

## 2022-12-17 ENCOUNTER — Encounter: Payer: Self-pay | Admitting: Emergency Medicine

## 2022-12-17 ENCOUNTER — Emergency Department: Payer: Medicare HMO

## 2022-12-17 ENCOUNTER — Emergency Department
Admission: EM | Admit: 2022-12-17 | Discharge: 2022-12-17 | Disposition: A | Discharge status: Home / Self Care | Payer: Medicare HMO | Attending: Emergency Medicine | Admitting: Emergency Medicine

## 2022-12-17 ENCOUNTER — Other Ambulatory Visit: Payer: Self-pay

## 2022-12-17 DIAGNOSIS — R1010 Upper abdominal pain, unspecified: Secondary | ICD-10-CM | POA: Diagnosis not present

## 2022-12-17 DIAGNOSIS — I10 Essential (primary) hypertension: Secondary | ICD-10-CM | POA: Diagnosis not present

## 2022-12-17 DIAGNOSIS — R10819 Abdominal tenderness, unspecified site: Secondary | ICD-10-CM | POA: Diagnosis not present

## 2022-12-17 DIAGNOSIS — R109 Unspecified abdominal pain: Secondary | ICD-10-CM | POA: Diagnosis not present

## 2022-12-17 LAB — CBC WITH DIFFERENTIAL/PLATELET
Abs Immature Granulocytes: 0.03 10*3/uL (ref 0.00–0.07)
Basophils Absolute: 0.1 10*3/uL (ref 0.0–0.1)
Basophils Relative: 1 %
Eosinophils Absolute: 0.3 10*3/uL (ref 0.0–0.5)
Eosinophils Relative: 2 %
HCT: 44.5 % (ref 36.0–46.0)
Hemoglobin: 15.1 g/dL — ABNORMAL HIGH (ref 12.0–15.0)
Immature Granulocytes: 0 %
Lymphocytes Relative: 17 %
Lymphs Abs: 1.9 10*3/uL (ref 0.7–4.0)
MCH: 30.4 pg (ref 26.0–34.0)
MCHC: 33.9 g/dL (ref 30.0–36.0)
MCV: 89.7 fL (ref 80.0–100.0)
Monocytes Absolute: 0.9 10*3/uL (ref 0.1–1.0)
Monocytes Relative: 8 %
Neutro Abs: 8.3 10*3/uL — ABNORMAL HIGH (ref 1.7–7.7)
Neutrophils Relative %: 72 %
Platelets: 250 10*3/uL (ref 150–400)
RBC: 4.96 MIL/uL (ref 3.87–5.11)
RDW: 12.5 % (ref 11.5–15.5)
WBC: 11.5 10*3/uL — ABNORMAL HIGH (ref 4.0–10.5)
nRBC: 0 % (ref 0.0–0.2)

## 2022-12-17 LAB — URINALYSIS, ROUTINE W REFLEX MICROSCOPIC
Bilirubin Urine: NEGATIVE
Glucose, UA: NEGATIVE mg/dL
Hgb urine dipstick: NEGATIVE
Ketones, ur: NEGATIVE mg/dL
Leukocytes,Ua: NEGATIVE
Nitrite: NEGATIVE
Protein, ur: NEGATIVE mg/dL
Specific Gravity, Urine: 1.019 (ref 1.005–1.030)
pH: 5 (ref 5.0–8.0)

## 2022-12-17 LAB — COMPREHENSIVE METABOLIC PANEL
ALT: 24 U/L (ref 0–44)
AST: 35 U/L (ref 15–41)
Albumin: 3.9 g/dL (ref 3.5–5.0)
Alkaline Phosphatase: 79 U/L (ref 38–126)
Anion gap: 10 (ref 5–15)
BUN: 13 mg/dL (ref 8–23)
CO2: 17 mmol/L — ABNORMAL LOW (ref 22–32)
Calcium: 10.5 mg/dL — ABNORMAL HIGH (ref 8.9–10.3)
Chloride: 108 mmol/L (ref 98–111)
Creatinine, Ser: 1.07 mg/dL — ABNORMAL HIGH (ref 0.44–1.00)
GFR, Estimated: 52 mL/min — ABNORMAL LOW (ref >60)
Glucose, Bld: 106 mg/dL — ABNORMAL HIGH (ref 70–99)
Potassium: 4.5 mmol/L (ref 3.5–5.1)
Sodium: 135 mmol/L (ref 135–145)
Total Bilirubin: 1.4 mg/dL — ABNORMAL HIGH (ref <1.2)
Total Protein: 6.7 g/dL (ref 6.5–8.1)

## 2022-12-17 LAB — LIPASE, BLOOD: Lipase: 48 U/L (ref 11–51)

## 2022-12-17 MED ORDER — IOHEXOL 300 MG/ML  SOLN
100.0000 mL | Freq: Once | INTRAMUSCULAR | Status: AC | PRN
  Administered 2022-12-17: 100 mL via INTRAVENOUS

## 2022-12-17 MED ORDER — IOHEXOL 350 MG/ML SOLN: 100.0000 mL | Freq: Once | INTRAVENOUS | Status: DC | PRN

## 2022-12-17 NOTE — ED Provider Notes (Signed)
[facility] Provider Note    Event Date/Time   First MD Initiated Contact with Patient 12/17/22 1412     (approximate)   History   Abdominal Pain   HPI  Karen Harvey is a 82 y.o. female with a history of hypertension, hyperlipidemia, GERD, arthritis, and anxiety who presents with abdominal pain mainly in the upper abdomen and radiating across both sides.  The patient states she has had the pain about a week.  It is intermittent, not triggered by food, and not associated with any nausea, vomiting, or diarrhea.  She was diagnosed with a UTI last week and is finishing her course of antibiotics.  She denies any urinary symptoms at this time.  She has no fever or chills.  She denies any distention to the abdomen.  I reviewed the past medical records.  The patient was seen at [facility] clinic on 12/3 with generalized abdominal pain.  She was diagnosed with UTI at that time.   Physical Exam   Triage Vital Signs: ED Triage Vitals [12/17/22 1153]  Encounter Vitals Group     BP (!) 146/66     Systolic BP Percentile      Diastolic BP Percentile      Pulse Rate 73     Resp 18     Temp 98.2 F (36.8 C)     Temp Source Oral     SpO2 95 %     Weight 160 lb 15 oz (73 kg)     Height 5\' 2"  (1.575 m)     Head Circumference      [facility] Flow      Pain Score 0     Pain Loc      Pain Education      Exclude from Growth Chart     Most recent vital signs: Vitals:   12/17/22 1153  BP: (!) 146/66  Pulse: 73  Resp: 18  Temp: 98.2 F (36.8 C)  SpO2: 95%     General: Awake, well-appearing, no distress.  CV:  Good peripheral perfusion.  Resp:  Normal effort.  Abd:  Soft with mild epigastric tenderness.  No distention.  Other:  No jaundice or scleral icterus.   ED Results / Procedures / Treatments   Labs (all labs ordered are listed, but only abnormal results are displayed) Labs Reviewed  COMPREHENSIVE METABOLIC PANEL - Abnormal; Notable for the following  components:      Result Value   CO2 17 (*)    Glucose, Bld 106 (*)    Creatinine, Ser 1.07 (*)    Calcium 10.5 (*)    Total Bilirubin 1.4 (*)    GFR, Estimated 52 (*)    All other components within normal limits  CBC WITH DIFFERENTIAL/PLATELET - Abnormal; Notable for the following components:   WBC 11.5 (*)    Hemoglobin 15.1 (*)    Neutro Abs 8.3 (*)    All other components within normal limits  URINALYSIS, ROUTINE W REFLEX MICROSCOPIC - Abnormal; Notable for the following components:   Color, Urine YELLOW (*)    APPearance CLEAR (*)    All other components within normal limits  LIPASE, BLOOD     EKG     RADIOLOGY  CT abdomen/pelvis: Pending   PROCEDURES:  Critical Care performed: No  Procedures   MEDICATIONS ORDERED IN ED: Medications - No data to display   IMPRESSION / MDM / ASSESSMENT AND PLAN / ED COURSE  I reviewed the triage  vital signs and the nursing notes.  82 year old female with PMH as noted above presents with 1 week of upper abdominal pain with no significant associated symptoms.  The patient is finishing course of antibiotics for a UTI.  Differential diagnosis includes, but is not limited to, gastritis, GERD, PUD, pancreatitis, other hepatobiliary etiology, gastroenteritis, less likely colitis, diverticulitis, SBO.    Initial lab workup is reassuring.  LFTs and lipase are normal.  CBC shows no leukocytosis.  Urinalysis is negative.  Given the patient's age and the duration of the symptoms we will obtain a CT for further evaluation.  Patient's presentation is most consistent with acute complicated illness / injury requiring diagnostic workup.  ----------------------------------------- 3:06 PM on 12/17/2022 -----------------------------------------  CT is pending.  I have signed the patient out to the oncoming ED physician.   FINAL CLINICAL IMPRESSION(S) / ED DIAGNOSES   Final diagnoses:  Pain of upper abdomen     Rx / DC Orders   ED  Discharge Orders     None        Note:  This document was prepared using Dragon voice recognition software and may include unintentional dictation errors.    Dionne Bucy, MD 12/17/22 1506

## 2022-12-17 NOTE — ED Provider Triage Note (Signed)
Emergency Medicine Provider Triage Evaluation Note  Karen Harvey , a 82 y.o. female  was evaluated in triage.  Pt complains of abdominal tenderness across the top of the abdomen. Patient reports she has had pain for 2-3 weeks on and off. Reports she has been burping a lot. Patient recently completed antibiotics for a UTI, was on ciprofloxacin.  Review of Systems  Positive: Abdominal tenderness Negative: N/v/d/c, fevers, urinary symptoms  Physical Exam  There were no vitals taken for this visit. Gen:   Awake, no distress   Resp:  Normal effort  MSK:   Moves extremities without difficulty  Other:    Medical Decision Making  Medically screening exam initiated at 11:53 AM.  Appropriate orders placed.  [name] was informed that the remainder of the evaluation will be completed by another provider, this initial triage assessment does not replace that evaluation, and the importance of remaining in the ED until their evaluation is complete.     [name], PA-C 12/17/22 1157

## 2022-12-17 NOTE — ED Triage Notes (Signed)
Patient to ED via POV for abd tenderness x1 week. States she has burping up food more than normal. Took last dose of antibiotic for kidney infection this AM. Denies N/V/D.

## 2022-12-17 NOTE — ED Provider Notes (Signed)
  Physical Exam  BP (!) 155/62 (BP Location: Right Arm)   Pulse 63   Temp 98.5 F (36.9 C) (Oral)   Resp 16   Ht 5\' 2"  (1.575 m)   Wt 73 kg   SpO2 98%   BMI 29.44 kg/m   Physical Exam  Procedures  Procedures  ED Course / MDM    Medical Decision Making Amount and/or Complexity of Data Reviewed Radiology: ordered.  Risk Prescription drug management.   Received patient in signout.  82 year old female with history of HTN, HLD, GERD presenting today for upper abdominal pain.  Intermittent and not associated with nausea, vomiting, or diarrhea.  Recently diagnosed with UTI around symptom onset and has been on antibiotics since then.  Laboratory workup largely reassuring.  Patient signed out pending CT.  CT abdomen/pelvis shows no acute intra-abdominal pathology.  Patient otherwise well-appearing and tolerating p.o.  Will discharge with follow-up with PCP as needed.  Given strict return precautions.     Janith Lima, MD 12/17/22 [phone]

## 2023-03-18 DIAGNOSIS — H02055 Trichiasis without entropian left lower eyelid: Secondary | ICD-10-CM | POA: Diagnosis not present

## 2023-03-18 DIAGNOSIS — J069 Acute upper respiratory infection, unspecified: Secondary | ICD-10-CM | POA: Diagnosis not present

## 2023-03-21 DIAGNOSIS — I10 Essential (primary) hypertension: Secondary | ICD-10-CM | POA: Diagnosis not present

## 2023-03-21 DIAGNOSIS — E7800 Pure hypercholesterolemia, unspecified: Secondary | ICD-10-CM | POA: Diagnosis not present

## 2023-03-21 DIAGNOSIS — R7309 Other abnormal glucose: Secondary | ICD-10-CM | POA: Diagnosis not present

## 2023-03-21 DIAGNOSIS — Z0000 Encounter for general adult medical examination without abnormal findings: Secondary | ICD-10-CM | POA: Diagnosis not present

## 2023-03-21 DIAGNOSIS — Z6830 Body mass index (BMI) 30.0-30.9, adult: Secondary | ICD-10-CM | POA: Diagnosis not present

## 2023-03-21 DIAGNOSIS — M1712 Unilateral primary osteoarthritis, left knee: Secondary | ICD-10-CM | POA: Diagnosis not present

## 2023-03-28 DIAGNOSIS — K219 Gastro-esophageal reflux disease without esophagitis: Secondary | ICD-10-CM | POA: Diagnosis not present

## 2023-03-28 DIAGNOSIS — Z0000 Encounter for general adult medical examination without abnormal findings: Secondary | ICD-10-CM | POA: Diagnosis not present

## 2023-03-28 DIAGNOSIS — R7309 Other abnormal glucose: Secondary | ICD-10-CM | POA: Diagnosis not present

## 2023-03-28 DIAGNOSIS — I10 Essential (primary) hypertension: Secondary | ICD-10-CM | POA: Diagnosis not present

## 2023-03-28 DIAGNOSIS — E782 Mixed hyperlipidemia: Secondary | ICD-10-CM | POA: Diagnosis not present

## 2023-03-28 DIAGNOSIS — M1712 Unilateral primary osteoarthritis, left knee: Secondary | ICD-10-CM | POA: Diagnosis not present

## 2023-03-28 DIAGNOSIS — Z96651 Presence of right artificial knee joint: Secondary | ICD-10-CM | POA: Diagnosis not present

## 2023-05-24 DIAGNOSIS — M79642 Pain in left hand: Secondary | ICD-10-CM | POA: Diagnosis not present

## 2023-05-24 DIAGNOSIS — M25562 Pain in left knee: Secondary | ICD-10-CM | POA: Diagnosis not present

## 2023-07-07 IMAGING — DX DG KNEE 1-2V*R*
2 series · 2 of 2 positions shown · non-contrast
Comparison: None.

CLINICAL DATA: Right total knee arthroplasty

EXAM:
RIGHT KNEE - 1-2 VIEW

[knee ap]
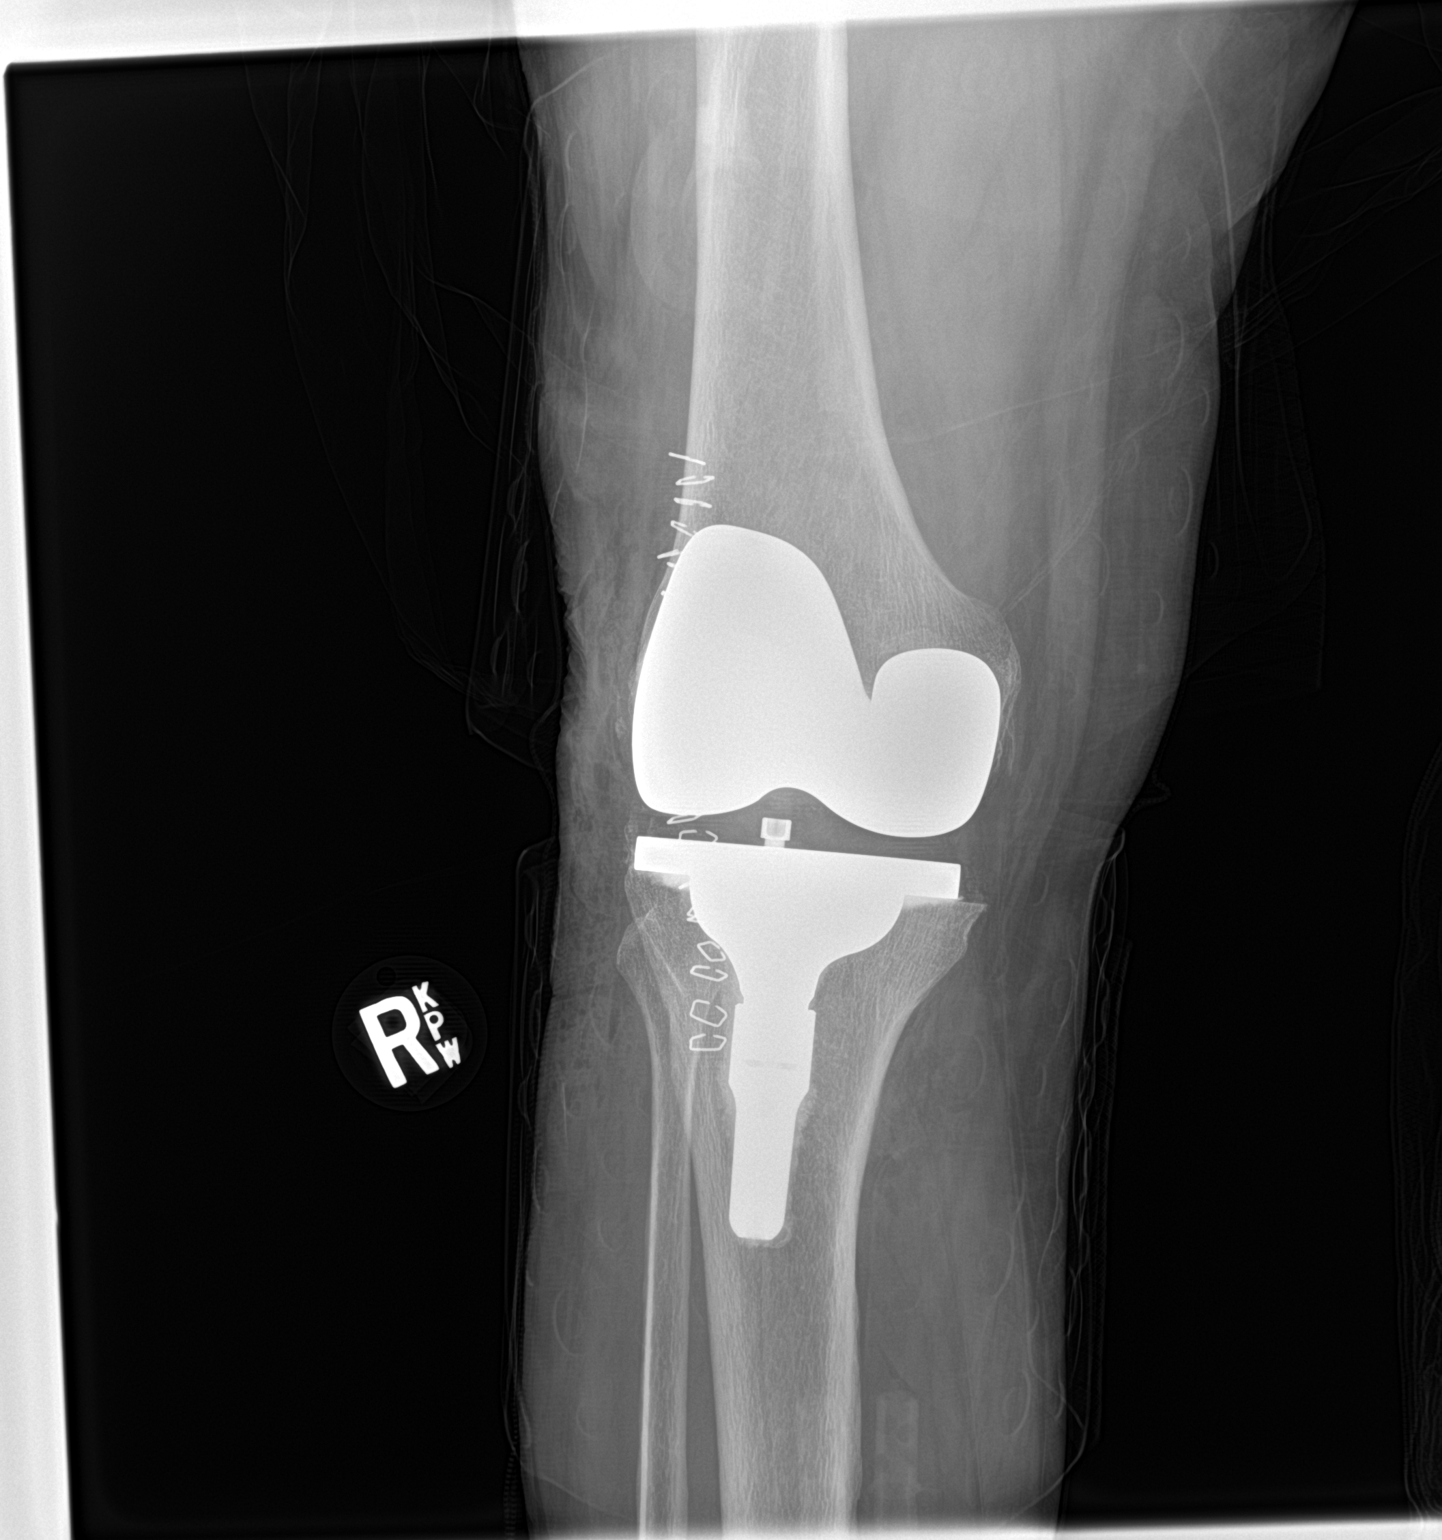

[knee lat]
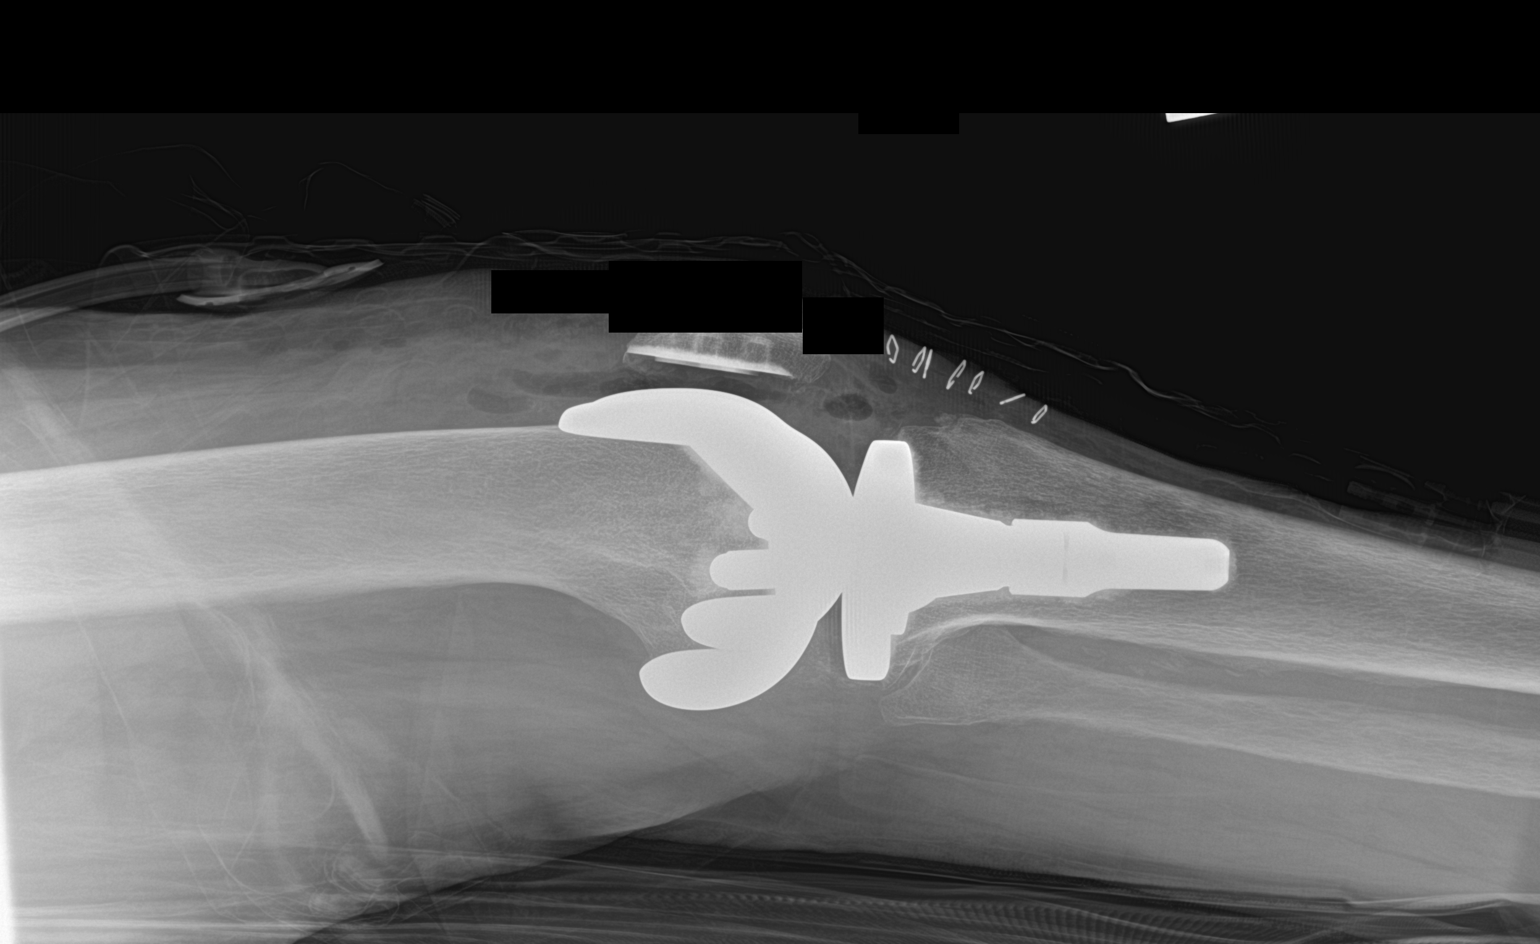

[2 of 2 positions shown; findings below may reference images not displayed]

FINDINGS: Changes of right knee replacement. Soft tissue and joint space gas.
No hardware bony complicating feature.
IMPRESSION: Right knee replacement.  No visible complicating feature.

## 2023-07-10 IMAGING — CR DG KNEE COMPLETE 4+V*R*
1 series · 4 of 4 positions shown · non-contrast
Comparison: December 16, 2020.

CLINICAL DATA: Knee pain in a female at age 80.

EXAM:
RIGHT KNEE - COMPLETE 4+ VIEW

[Series 1: dg knee complete 4 views right · 0.14mm/px · 4 of 4 slices shown]
[im 1/4]
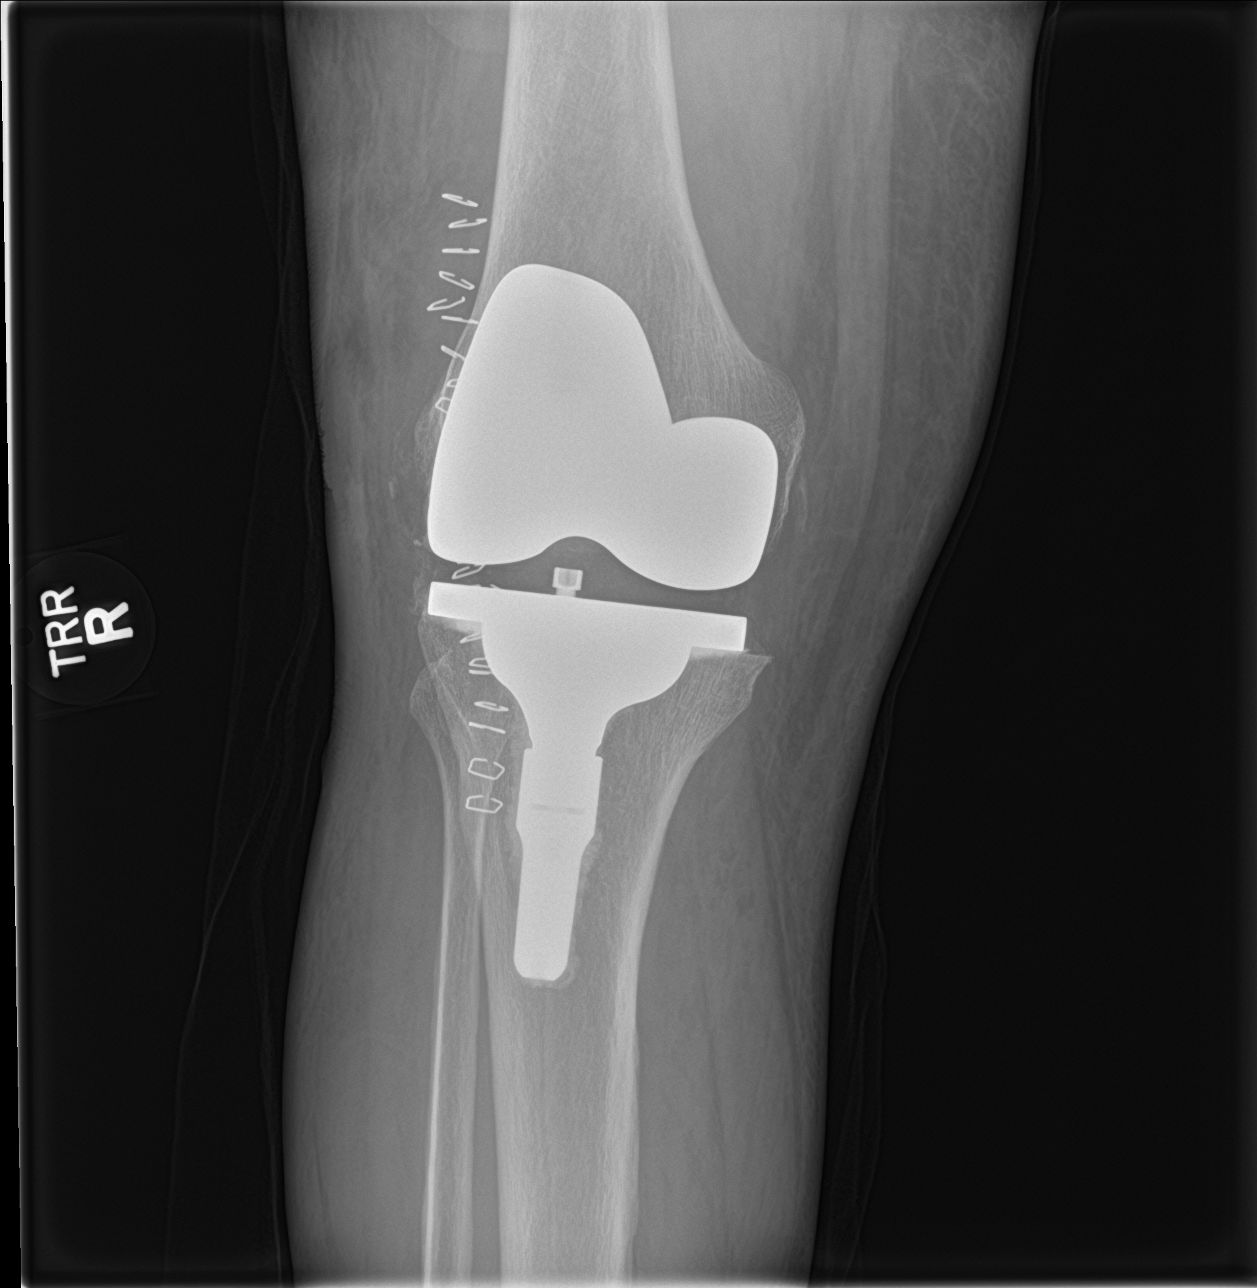
[im 2/4]
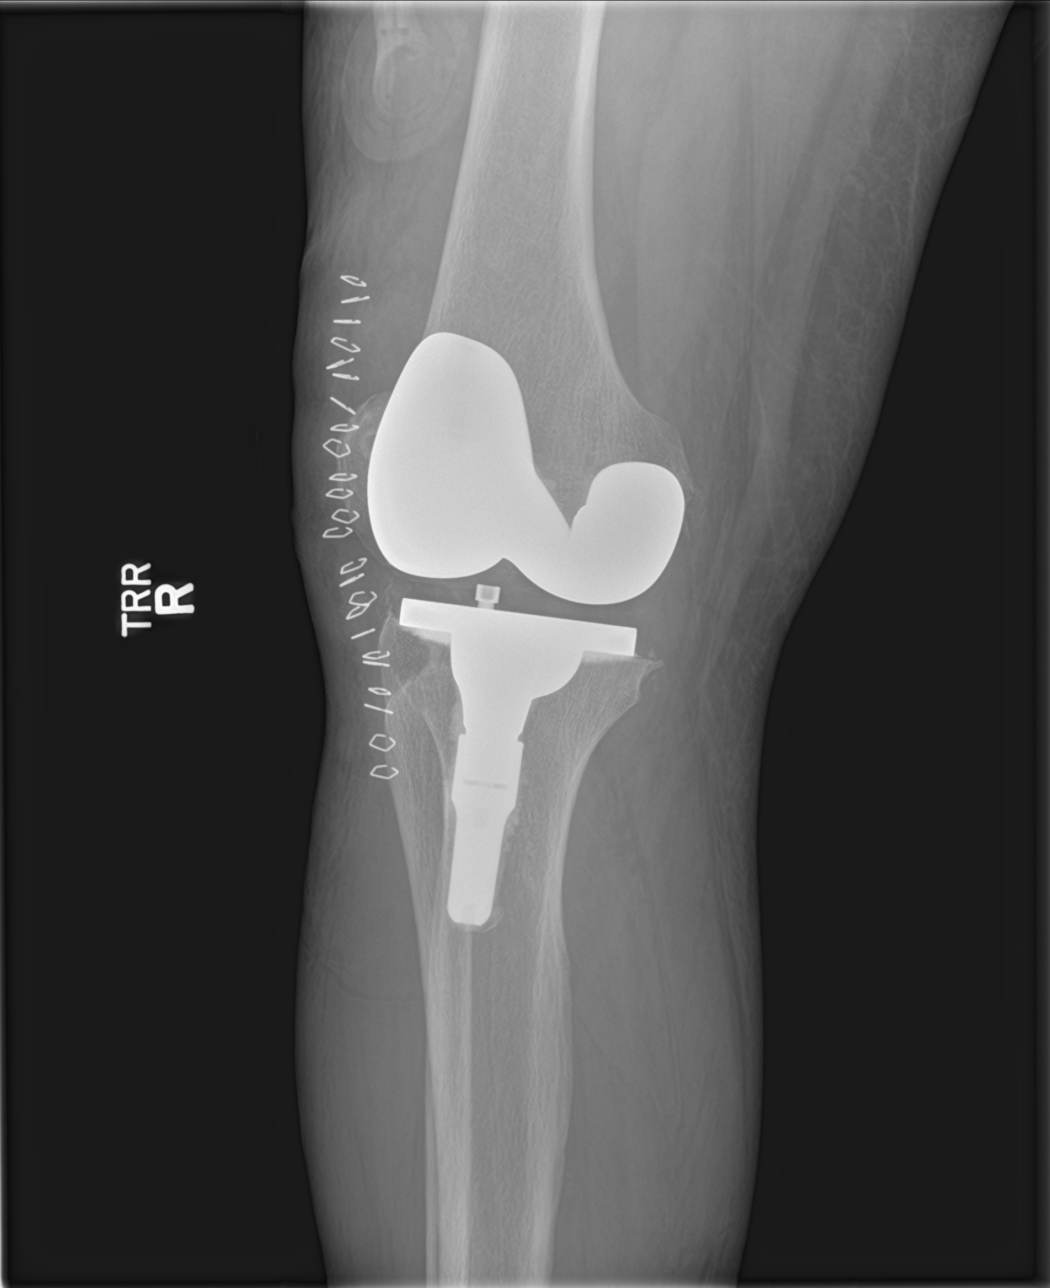
[im 3/4]
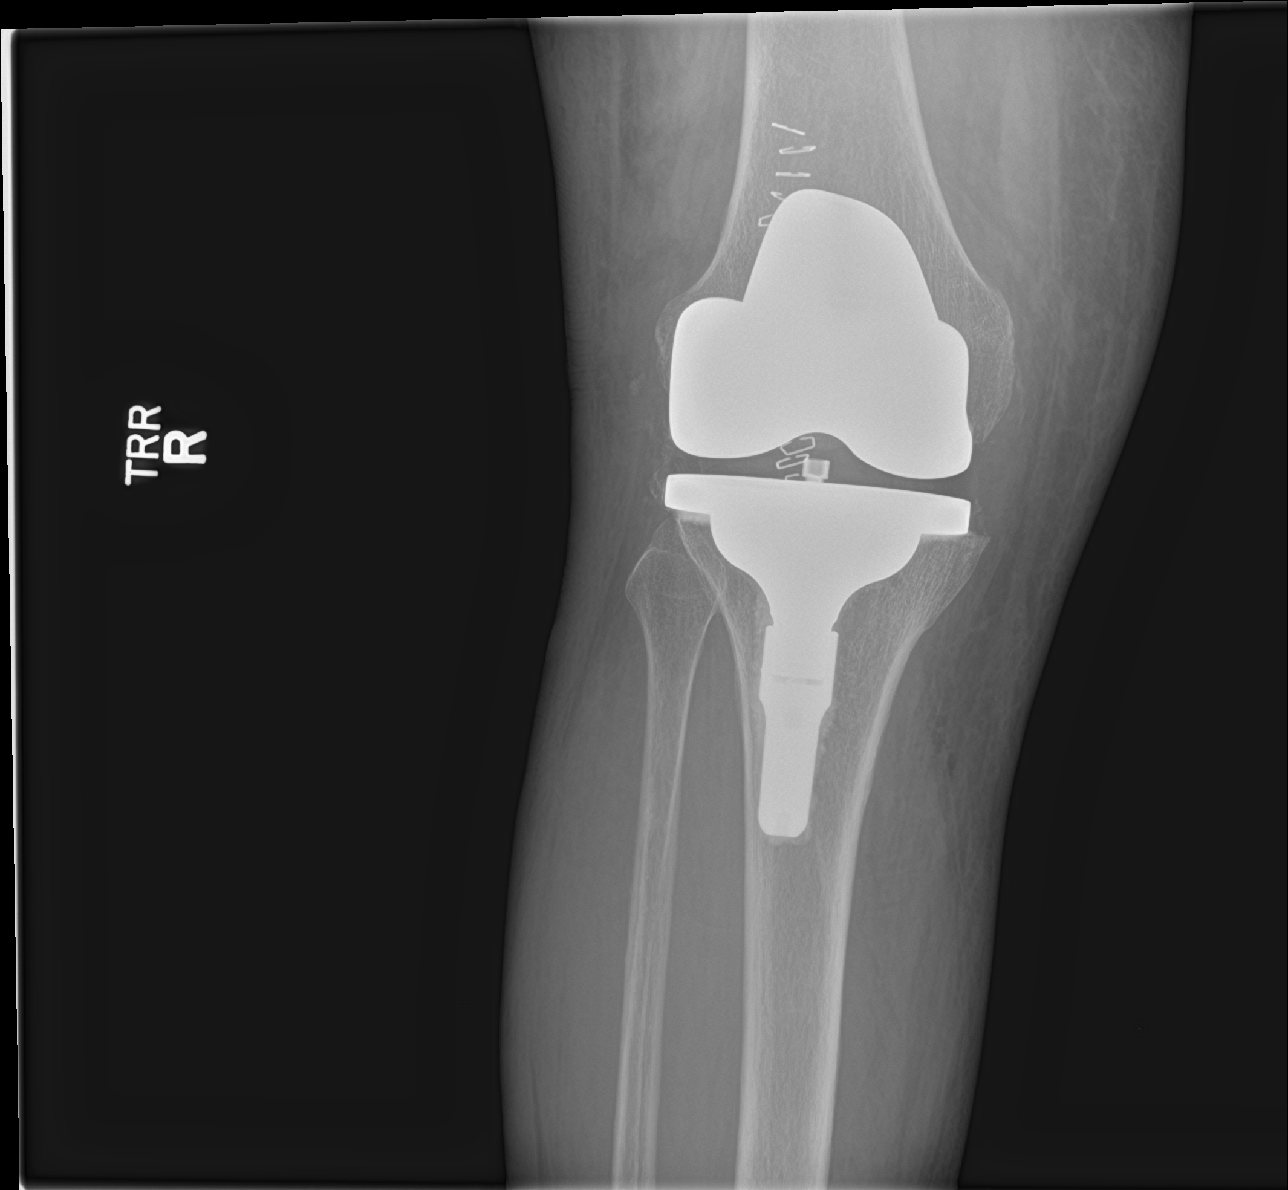
[im 4/4]
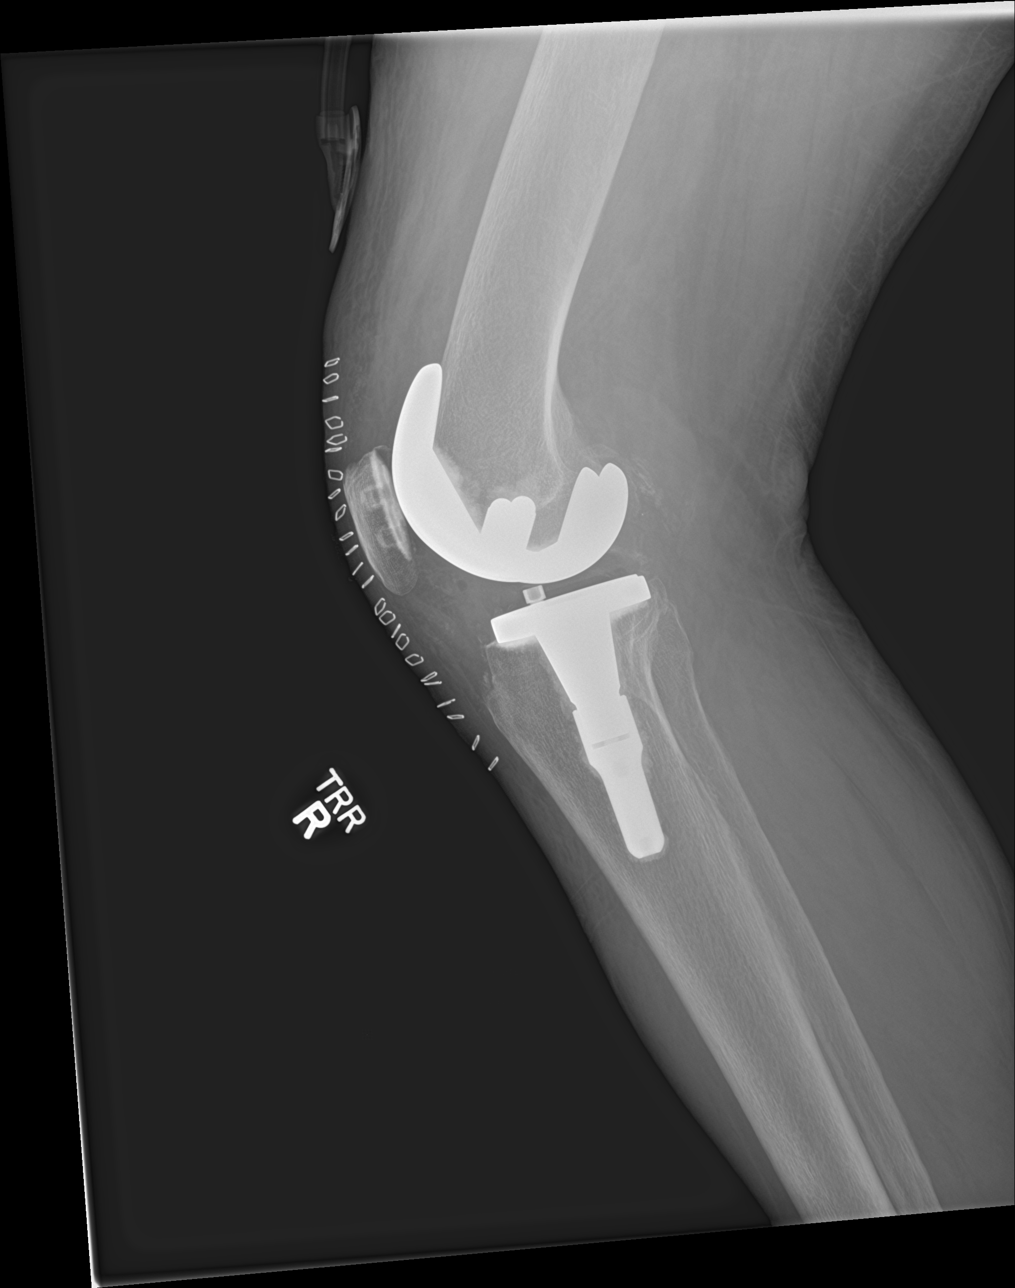

[4 of 4 positions shown; findings below may reference images not displayed]

FINDINGS: Recent postoperative changes of total knee arthroplasty. Skin
staples over the site of the arthroplasty anteriorly. No acute
fracture or dislocation. Near complete resolution of small amounts
of gas in the joint that were seen postoperatively perhaps with a
single locule of gas in the inferior joint space. No substantial
joint effusion. Mild soft tissue swelling also has diminished since
recent imaging.
IMPRESSION: Resolving postoperative changes on radiograph. Skin staples
remaining in place over the knee following total knee arthroplasty.

## 2023-09-19 DIAGNOSIS — I10 Essential (primary) hypertension: Secondary | ICD-10-CM | POA: Diagnosis not present

## 2023-09-19 DIAGNOSIS — R7309 Other abnormal glucose: Secondary | ICD-10-CM | POA: Diagnosis not present

## 2023-09-19 DIAGNOSIS — K219 Gastro-esophageal reflux disease without esophagitis: Secondary | ICD-10-CM | POA: Diagnosis not present

## 2023-09-19 DIAGNOSIS — R8290 Unspecified abnormal findings in urine: Secondary | ICD-10-CM | POA: Diagnosis not present

## 2023-09-19 DIAGNOSIS — Z96651 Presence of right artificial knee joint: Secondary | ICD-10-CM | POA: Diagnosis not present

## 2023-09-19 DIAGNOSIS — M1712 Unilateral primary osteoarthritis, left knee: Secondary | ICD-10-CM | POA: Diagnosis not present

## 2023-09-19 DIAGNOSIS — E7800 Pure hypercholesterolemia, unspecified: Secondary | ICD-10-CM | POA: Diagnosis not present

## 2023-09-19 DIAGNOSIS — Z6829 Body mass index (BMI) 29.0-29.9, adult: Secondary | ICD-10-CM | POA: Diagnosis not present

## 2023-09-19 DIAGNOSIS — E782 Mixed hyperlipidemia: Secondary | ICD-10-CM | POA: Diagnosis not present

## 2023-10-16 DIAGNOSIS — H2512 Age-related nuclear cataract, left eye: Secondary | ICD-10-CM | POA: Diagnosis not present

## 2023-10-16 DIAGNOSIS — H40003 Preglaucoma, unspecified, bilateral: Secondary | ICD-10-CM | POA: Diagnosis not present

## 2023-10-16 DIAGNOSIS — H43813 Vitreous degeneration, bilateral: Secondary | ICD-10-CM | POA: Diagnosis not present
